# Patient Record
Sex: Male | Born: 1985 | Race: White | Hispanic: No | Marital: Single | Smoking: Current every day smoker
Health system: Southern US, Community
[De-identification: ages and names within clinical notes are randomized; demographics above are authoritative.]

---

## 2007-12-14 ENCOUNTER — Emergency Department (HOSPITAL_COMMUNITY): Admission: EM | Admit: 2007-12-14 | Discharge: 2007-12-14 | Payer: Self-pay | Admitting: Emergency Medicine

## 2008-05-26 ENCOUNTER — Emergency Department (HOSPITAL_COMMUNITY): Admission: EM | Admit: 2008-05-26 | Discharge: 2008-05-27 | Payer: Self-pay | Admitting: Emergency Medicine

## 2010-06-17 LAB — DIFFERENTIAL
Basophils Absolute: 0.1 10*3/uL (ref 0.0–0.1)
Eosinophils Absolute: 0.1 10*3/uL (ref 0.0–0.7)
Eosinophils Relative: 1 % (ref 0–5)
Lymphocytes Relative: 25 % (ref 12–46)
Lymphs Abs: 3.3 10*3/uL (ref 0.7–4.0)
Monocytes Absolute: 0.9 10*3/uL (ref 0.1–1.0)
Monocytes Relative: 7 % (ref 3–12)
Neutro Abs: 8.8 10*3/uL — ABNORMAL HIGH (ref 1.7–7.7)

## 2010-06-17 LAB — BASIC METABOLIC PANEL
CO2: 24 mEq/L (ref 19–32)
Calcium: 9.1 mg/dL (ref 8.4–10.5)
Chloride: 101 mEq/L (ref 96–112)
Creatinine, Ser: 0.81 mg/dL (ref 0.4–1.5)
GFR calc Af Amer: >60 mL/min (ref >60)
Glucose, Bld: 102 mg/dL — ABNORMAL HIGH (ref 70–99)
Potassium: 3.3 mEq/L — ABNORMAL LOW (ref 3.5–5.1)
Sodium: 134 mEq/L — ABNORMAL LOW (ref 135–145)

## 2010-06-17 LAB — CBC
MCV: 87.7 fL (ref 78.0–100.0)
WBC: 13.2 10*3/uL — ABNORMAL HIGH (ref 4.0–10.5)

## 2010-06-17 LAB — POCT CARDIAC MARKERS

## 2010-11-22 ENCOUNTER — Emergency Department (HOSPITAL_COMMUNITY)
Admission: EM | Admit: 2010-11-22 | Discharge: 2010-11-22 | Disposition: A | Discharge status: Home / Self Care | Payer: Self-pay | Attending: Emergency Medicine | Admitting: Emergency Medicine

## 2010-11-22 ENCOUNTER — Encounter: Payer: Self-pay | Admitting: *Deleted

## 2010-11-22 DIAGNOSIS — L989 Disorder of the skin and subcutaneous tissue, unspecified: Secondary | ICD-10-CM | POA: Insufficient documentation

## 2010-11-22 DIAGNOSIS — L089 Local infection of the skin and subcutaneous tissue, unspecified: Secondary | ICD-10-CM

## 2010-11-22 MED ORDER — BACITRACIN-NEOMYCIN-POLYMYXIN 400-5-5000 EX OINT
TOPICAL_OINTMENT | Freq: Once | CUTANEOUS | Status: AC
  Administered 2010-11-22: 21:00:00 via TOPICAL
  Filled 2010-11-22: qty 1

## 2010-11-22 MED ORDER — DOXYCYCLINE HYCLATE 100 MG PO TABS
100.0000 mg | ORAL_TABLET | Freq: Once | ORAL | Status: AC
  Administered 2010-11-22: 100 mg via ORAL
  Filled 2010-11-22: qty 1

## 2010-11-22 MED ORDER — DOXYCYCLINE HYCLATE 100 MG PO CAPS: 100.0000 mg | ORAL_CAPSULE | Freq: Two times a day (BID) | ORAL | Status: AC

## 2010-11-22 MED ORDER — HYDROCODONE-ACETAMINOPHEN 7.5-325 MG PO TABS: 1.0000 | ORAL_TABLET | Freq: Four times a day (QID) | ORAL | Status: AC | PRN

## 2010-11-22 MED ORDER — HYDROCODONE-ACETAMINOPHEN 5-325 MG PO TABS
1.0000 | ORAL_TABLET | Freq: Once | ORAL | Status: AC
  Administered 2010-11-22: 1 via ORAL
  Filled 2010-11-22: qty 1

## 2010-11-22 NOTE — ED Notes (Signed)
Pt states that he thinks that he may have bitten by a spider, isn't sure, has area with scab and redness around scab to inside of left thumb area. No drainage noted, pt states that the area has gotten bigger, more painful

## 2010-11-22 NOTE — ED Notes (Signed)
Infected and painful thumb, possible spider bite

## 2010-11-22 NOTE — ED Provider Notes (Signed)
History     CSN: [id number] Arrival date & time: 11/22/2010  7:56 PM   Chief Complaint  Patient presents with  . Insect Bite     (Include location/radiation/quality/duration/timing/severity/associated sxs/prior treatment) HPI Comments: Patient c/o lesion to the right thumb for several days.  States he may have been bitten by something.  States he has tried squeezing the area but noticed increased pain, thickening of the skin and redness surrounding the lesion.  Denies numbness, fever or swelling  Patient is a 25 y.o. male presenting with hand pain. The history is provided by the patient.  Hand Pain This is a new problem. The current episode started in the past 7 days. The problem occurs constantly. The problem has been unchanged. Associated symptoms include myalgias. Pertinent negatives include no arthralgias, fever, nausea, neck pain, numbness, rash or weakness. Exacerbated by: movement , palpation. He has tried nothing for the symptoms. The treatment provided no relief.     History reviewed. No pertinent past medical history.   History reviewed. No pertinent past surgical history.  History reviewed. No pertinent family history.  History  Substance Use Topics  . Smoking status: Not on file  . Smokeless tobacco: Not on file  . Alcohol Use: Not on file      Review of Systems  Constitutional: Negative for fever.  HENT: Negative for neck pain.   Gastrointestinal: Negative for nausea.  Musculoskeletal: Positive for myalgias. Negative for arthralgias.  Skin: Positive for color change and wound. Negative for pallor and rash.  Neurological: Negative for weakness and numbness.  Hematological: Negative for adenopathy. Does not bruise/bleed easily.  All other systems reviewed and are negative.    Allergies  Tramadol  Home Medications  No current outpatient prescriptions on file.  Physical Exam    BP 128/71  Pulse 83  Temp(Src) 97.9 F (36.6 C) (Oral)  Resp 20  Ht 5'  8" (1.727 m)  Wt 155 lb (70.308 kg)  BMI 23.57 kg/m2  SpO2 98%  Physical Exam  Nursing note and vitals reviewed. Constitutional: He is oriented to person, place, and time. He appears well-developed and well-nourished. No distress.  HENT:  Head: Normocephalic and atraumatic.  Cardiovascular: Normal rate, regular rhythm and normal heart sounds.   Pulmonary/Chest: Effort normal and breath sounds normal.  Musculoskeletal: He exhibits tenderness. He exhibits no edema.  Neurological: He is alert and oriented to person, place, and time. He has normal reflexes. He displays normal reflexes. No cranial nerve deficit. He exhibits normal muscle tone. Coordination normal.  Skin: Skin is warm. There is erythema.       2 cm indurated lesion to the proximal right thumb.  No fluctuance, drainage.  Surrounding area is slight erythematous.  No necrotic appearing tissue    ED Course  Procedures        MDM   2 cm slightly erythematous lesion to the medial left thumb.  No drainage, lymphangitis or fluctuance.  Sensation of the thumb is intact.  CR<2 sec, pt has full ROM of the digit.  Pt agrees to return to ER for any worsening symptoms  Probable infected insect bite vs early abscess.  I Dohn begin abx and have pt apply warm water soaks and return here for recheck.  Patient / Family / Caregiver understand and agree with initial ED impression and plan with expectations set for ED visit.        Berl Bonfanti L. [place], [place] 11/29/10 2140

## 2010-11-25 ENCOUNTER — Encounter (HOSPITAL_COMMUNITY): Payer: Self-pay | Admitting: *Deleted

## 2010-11-25 ENCOUNTER — Emergency Department (HOSPITAL_COMMUNITY)
Admission: EM | Admit: 2010-11-25 | Discharge: 2010-11-25 | Disposition: A | Discharge status: Home / Self Care | Payer: Self-pay | Attending: Emergency Medicine | Admitting: Emergency Medicine

## 2010-11-25 DIAGNOSIS — L0291 Cutaneous abscess, unspecified: Secondary | ICD-10-CM

## 2010-11-25 DIAGNOSIS — Z5189 Encounter for other specified aftercare: Secondary | ICD-10-CM | POA: Insufficient documentation

## 2010-11-25 DIAGNOSIS — L03019 Cellulitis of unspecified finger: Secondary | ICD-10-CM | POA: Insufficient documentation

## 2010-11-25 DIAGNOSIS — L02519 Cutaneous abscess of unspecified hand: Secondary | ICD-10-CM | POA: Insufficient documentation

## 2010-11-25 MED ORDER — OXYCODONE-ACETAMINOPHEN 5-325 MG PO TABS
1.0000 | ORAL_TABLET | Freq: Once | ORAL | Status: AC
  Administered 2010-11-25: 1 via ORAL
  Filled 2010-11-25: qty 1

## 2010-11-25 MED ORDER — OXYCODONE-ACETAMINOPHEN 5-325 MG PO TABS: 1.0000 | ORAL_TABLET | ORAL | Status: AC | PRN

## 2010-11-25 MED ORDER — DOXYCYCLINE HYCLATE 100 MG PO TABS
100.0000 mg | ORAL_TABLET | Freq: Once | ORAL | Status: AC
  Administered 2010-11-25: 100 mg via ORAL
  Filled 2010-11-25: qty 1

## 2010-11-25 NOTE — ED Provider Notes (Signed)
History     CSN: [id number] Arrival date & time: 11/25/2010  3:18 PM  Chief Complaint  Patient presents with  . Wound Check    HPI  (Consider location/radiation/quality/duration/timing/severity/associated sxs/prior treatment)  Patient is a 25 y.o. male presenting with wound check. The history is provided by the patient.  Wound Check  He was treated in the ED 2 to 3 days ago. Previous treatment in the ED includes oral antibiotics. Treatments since wound repair include oral antibiotics, soaks and regular soap and water washings. Fever duration: no fever. There has been colored discharge from the wound. The redness has improved. The swelling has improved. The pain has worsened. He has no difficulty moving the affected extremity or digit.    History reviewed. No pertinent past medical history.  History reviewed. No pertinent past surgical history.  No family history on file.  History  Substance Use Topics  . Smoking status: Current Everyday Smoker    Types: Cigarettes  . Smokeless tobacco: Not on file  . Alcohol Use: No      Review of Systems  Review of Systems  Constitutional: Negative for fever.  Musculoskeletal: Negative for joint swelling and arthralgias.  Skin: Positive for wound. Negative for color change.  Neurological: Negative for dizziness, weakness and numbness.  Hematological: Negative for adenopathy. Does not bruise/bleed easily.  All other systems reviewed and are negative.    Allergies  Tramadol  Home Medications   Current Outpatient Rx  Name Route Sig Dispense Refill  . DOXYCYCLINE HYCLATE 100 MG PO CAPS Oral Take 1 capsule (100 mg total) by mouth 2 (two) times daily. For 10 days 20 capsule 0  . HYDROCODONE-ACETAMINOPHEN 7.5-325 MG PO TABS Oral Take 1 tablet by mouth every 6 (six) hours as needed for pain. 20 tablet 0    Physical Exam    BP 122/79  Pulse 66  Temp(Src) 97.7 F (36.5 C) (Oral)  Resp 16  Ht 5\' 8"  (1.727 m)  Wt 155 lb (70.308  kg)  BMI 23.57 kg/m2  SpO2 100%  Physical Exam  Nursing note and vitals reviewed. Constitutional: He appears well-developed and well-nourished. No distress.  Cardiovascular: Normal rate and regular rhythm.   Pulmonary/Chest: Effort normal and breath sounds normal.  Musculoskeletal: He exhibits tenderness. He exhibits no edema.       Right hand: He exhibits tenderness. He exhibits normal range of motion, no bony tenderness, normal two-point discrimination, normal capillary refill and no swelling. normal sensation noted. Normal strength noted.       Lesion to right thumb appears to be healing,  Pervious erythema improved.  No edema.  Distal sensation and ROM of the thumb is intact. CR< 2 sec  Neurological: He is alert. He has normal reflexes. He exhibits normal muscle tone. Coordination normal.  Skin: Skin is warm and dry. No erythema.       See MS exam    ED Course  Procedures (including critical care time)      MDM   4:57 PM Abscess to the right proximal thumb appears to be healing.  Slight drainage present.  Erythema improved.  I have have advised pt to return if sx's worsen and he agrees to continue the antibiotics as directed.  Distal sensation of the thumb is intact.  Pt has full ROm of the digit.  CR<2 sec.        Daksh Coates L. [place], [place] 12/04/10 1830

## 2010-11-25 NOTE — ED Notes (Signed)
Seen 9/17 for abscess - states is not any better.  Now having drainage with increased pain/swelling.

## 2010-11-30 NOTE — ED Provider Notes (Signed)
Medical screening examination/treatment/procedure(s) were performed by non-physician practitioner and as supervising physician I was immediately available for consultation/collaboration.   Benny Lennert, MD 11/30/10 [id number]

## 2010-12-07 LAB — POCT CARDIAC MARKERS
CKMB, poc: 3.6
Myoglobin, poc: 224
Troponin i, poc: <0.05

## 2010-12-07 LAB — COMPREHENSIVE METABOLIC PANEL
AST: 38 — ABNORMAL HIGH
Albumin: 4.2
Alkaline Phosphatase: 95
CO2: 26
Chloride: 104
Creatinine, Ser: 0.92
Sodium: 137
Total Bilirubin: 0.6

## 2010-12-07 LAB — CBC
Hemoglobin: 14.4
Platelets: 282
RDW: 12.7
WBC: 15.8 — ABNORMAL HIGH

## 2010-12-07 LAB — DIFFERENTIAL
Eosinophils Absolute: 0.1
Lymphocytes Relative: 11 — ABNORMAL LOW
Monocytes Absolute: 0.9
Neutro Abs: 13.1 — ABNORMAL HIGH
Neutrophils Relative %: 83 — ABNORMAL HIGH

## 2010-12-07 LAB — LIPASE, BLOOD: Lipase: 18

## 2010-12-07 NOTE — ED Provider Notes (Signed)
Medical screening examination/treatment/procedure(s) were performed by non-physician practitioner and as supervising physician I was immediately available for consultation/collaboration.   Daouda Lonzo W. Maddon Horton, MD 12/07/10 1255 

## 2012-01-16 ENCOUNTER — Encounter (INDEPENDENT_AMBULATORY_CARE_PROVIDER_SITE_OTHER): Payer: Self-pay | Admitting: General Surgery

## 2012-01-24 ENCOUNTER — Ambulatory Visit (INDEPENDENT_AMBULATORY_CARE_PROVIDER_SITE_OTHER): Payer: Self-pay | Admitting: General Surgery

## 2012-01-27 ENCOUNTER — Ambulatory Visit (INDEPENDENT_AMBULATORY_CARE_PROVIDER_SITE_OTHER): Payer: Medicaid Other | Admitting: General Surgery

## 2012-01-27 ENCOUNTER — Encounter (INDEPENDENT_AMBULATORY_CARE_PROVIDER_SITE_OTHER): Payer: Self-pay | Admitting: General Surgery

## 2012-01-27 VITALS — BP 116/78 | HR 67 | Temp 97.4°F | Ht 67.0 in | Wt 143.2 lb

## 2012-01-27 DIAGNOSIS — K4090 Unilateral inguinal hernia, without obstruction or gangrene, not specified as recurrent: Secondary | ICD-10-CM

## 2012-01-27 NOTE — Progress Notes (Signed)
Patient ID: Nicoletta Dress, male   DOB: [birth year], 26 y.o.   MRN: [id number]  Chief Complaint  Patient presents with  . Pre-op Exam    eval RIH    HPI Mance A Panek is a 26 y.o. male.Referred by Dr. Caryn Bee guarding or hernia. Patient states this occurred several weeks ago after lifting heavy objects at work. Patient states the pain has been causing some weakness and pain radiating down his leg.  Patient had no urinary symptoms or other problems.he has no signs of incarceration coagulation. HPI  History reviewed. No pertinent past medical history.  History reviewed. No pertinent past surgical history.  Family History  Problem Relation Age of Onset  . Hyperlipidemia Mother   . Hypertension Father     Social History History  Substance Use Topics  . Smoking status: Current Every Day Smoker -- 0.5 packs/day    Types: Cigarettes  . Smokeless tobacco: Not on file  . Alcohol Use: Yes     Comment: social    Allergies  Allergen Reactions  . Tramadol Nausea And Vomiting    Current Outpatient Prescriptions  Medication Sig Dispense Refill  . HYDROcodone-acetaminophen (VICODIN) 5-500 MG per tablet Take 1 tablet by mouth every 6 (six) hours as needed.        Review of Systems Review of Systems  Constitutional: Negative.   HENT: Negative.   Eyes: Negative.   Respiratory: Negative.   Cardiovascular: Negative.   Gastrointestinal: Negative.   Genitourinary: Negative.   Musculoskeletal: Negative.     Blood pressure 116/78, pulse 67, temperature 97.4 F (36.3 C), temperature source Temporal, height 5\' 7"  (1.702 m), weight 143 lb 3.2 oz (64.955 kg), SpO2 97.00%.  Physical Exam Physical Exam  Constitutional: He is oriented to person, place, and time. He appears well-developed and well-nourished.  HENT:  Head: Normocephalic and atraumatic.  Eyes: Conjunctivae normal and EOM are normal. Pupils are equal, round, and reactive to light.  Neck: Normal range of motion. Neck supple.    Cardiovascular: Normal rate and regular rhythm.   Pulmonary/Chest: Effort normal and breath sounds normal.  Abdominal: Soft. Bowel sounds are normal. A hernia is present. Hernia confirmed positive in the right inguinal area. Hernia confirmed negative in the left inguinal area.  Musculoskeletal: Normal range of motion.  Neurological: He is alert and oriented to person, place, and time.    Data Reviewed none  Assessment    26 year old male with a reducible right lower hernia.    Plan    1. We'll proceed to the operating room for laparoscopic radical hernia repair mesh.  2.All risks and benefits were discussed with the patient, to generally include infection, bleeding, damage to surrounding structures, and recurrence. Alternatives were offered and described.  All questions were answered and the patient voiced understanding of the procedure and wishes to proceed at this point.        Marigene Ehlers., Abhinav Mayorquin 01/27/2012, 2:29 PM

## 2012-01-30 ENCOUNTER — Telehealth (INDEPENDENT_AMBULATORY_CARE_PROVIDER_SITE_OTHER): Payer: Self-pay | Admitting: General Surgery

## 2012-01-30 NOTE — Telephone Encounter (Signed)
Pt called to ask about stool softeners.  Discussed his upcoming surgery for hernia repair and the pain he has when having BMs.  Advised to begin now using OTC Miralax and stool softeners QD.  If he has a well-established regimen for BMs before his surgery, it might benefit him after the surgery.  Reminded him that pain medicine is constipating as well.  He wants to ask Dr. Derrell Lolling for enough pain meds to last until his surgery which is scheduled in December.  Please advise and call pt.

## 2012-01-30 NOTE — Telephone Encounter (Signed)
Called back patient to advise the prescription has been filled by Dr. Derrell Lolling and ready for pickup. Advised patient to bring ID with him. Patient agreed.

## 2012-02-06 ENCOUNTER — Telehealth (INDEPENDENT_AMBULATORY_CARE_PROVIDER_SITE_OTHER): Payer: Self-pay

## 2012-02-06 NOTE — Telephone Encounter (Signed)
Pt requesting Oxycodone 10/325 mg pending his surgery on 02/27/12.  Dr. Derrell Lolling was paged and ok'd #[phone].  Pt was made aware no more pain medication would be filled before his surgery.

## 2012-02-10 ENCOUNTER — Ambulatory Visit (INDEPENDENT_AMBULATORY_CARE_PROVIDER_SITE_OTHER): Payer: Self-pay | Admitting: General Surgery

## 2012-02-13 ENCOUNTER — Telehealth (INDEPENDENT_AMBULATORY_CARE_PROVIDER_SITE_OTHER): Payer: Self-pay

## 2012-02-13 NOTE — Telephone Encounter (Signed)
The patient called requesting pain medicine.  He had been on Oxycodone but is asking for Hydrocodone.  His surgery is 12/23.  It feels like he has a bruise in the area.  He has no bulge at this time because he hasn't been heavy lifting. I paged Dr Derrell Lolling and he denied filling any more narcotics.  He recommends Ibuprofen 800mg  q 8 hrs.  I notified the pt and told him he can get it over the counter.

## 2012-02-21 ENCOUNTER — Telehealth (INDEPENDENT_AMBULATORY_CARE_PROVIDER_SITE_OTHER): Payer: Self-pay | Admitting: General Surgery

## 2012-02-21 NOTE — Telephone Encounter (Signed)
Pt called to ask for more narcotic pain meds; advised the Ibuprofen is not helping.  Reviewed notes from 02/13/12 where he was denied narcotics until after his surgery.  Reiterated this to him and suggested he add using a heating pad.  He was verbally unhappy with this.

## 2012-02-27 DIAGNOSIS — K4090 Unilateral inguinal hernia, without obstruction or gangrene, not specified as recurrent: Secondary | ICD-10-CM

## 2012-02-27 HISTORY — PX: HERNIA REPAIR: SHX51

## 2012-02-28 ENCOUNTER — Telehealth (INDEPENDENT_AMBULATORY_CARE_PROVIDER_SITE_OTHER): Payer: Self-pay

## 2012-02-28 NOTE — Telephone Encounter (Signed)
Patient calling into office requesting a refill on his pain medication.  Patient states he had hernia repair surgery on 02/27/12.  Patient worried our office would be closed and not able to have pain medication.  Patient made aware that it's too early to refill pain medication since his surgery was yesterday.  Patient made aware that our office [facility] be open on Thursday and Friday and the message Matthewjames be forwarded to Dr. Derrell Lolling for further review.  Patient advised to apply heat or ice, take ibuprofen and elevate legs when at rest.  Alyxander call patient after reviewed with Dr. Derrell Lolling

## 2012-03-01 ENCOUNTER — Telehealth (INDEPENDENT_AMBULATORY_CARE_PROVIDER_SITE_OTHER): Payer: Self-pay | Admitting: General Surgery

## 2012-03-01 NOTE — Telephone Encounter (Signed)
Patient called requesting a refill of oxycodone 5/325 - he is status post RIH repair on Monday 02/27/12. I offered to call in Norco 5/325 per protocol but patient states only the oxycodone is working for his pain. Please advise if we can write.

## 2012-03-01 NOTE — Telephone Encounter (Signed)
Shanda Bumps, pt's girlfriend and mother of his child, called to request more pain medicine for him.  She stated he was in horrible pain and had "overdone it" yesterday; picking up his nearly 20 lbs infant and generally too much activity for Christmas.  Reviewed notes from pt's call earlier today and offer of appt.  Shanda Bumps accepted appt for tomorrow at 3:00 and Emrick use an ice pack to the site for now.  Also recommended NSAIDs, per label directions.

## 2012-03-01 NOTE — Telephone Encounter (Signed)
Per Dr Derrell Lolling if patient still in a lot of pain he needs to be seen in the office prior to refill of narcotics. I advised patient of this. I offered appt for tomorrow at 2:45 pm. Patient was not happy with this and hung up on me.

## 2012-03-02 ENCOUNTER — Encounter (INDEPENDENT_AMBULATORY_CARE_PROVIDER_SITE_OTHER): Payer: Medicaid Other | Admitting: General Surgery

## 2012-03-13 ENCOUNTER — Telehealth (INDEPENDENT_AMBULATORY_CARE_PROVIDER_SITE_OTHER): Payer: Self-pay | Admitting: General Surgery

## 2012-03-13 ENCOUNTER — Encounter (INDEPENDENT_AMBULATORY_CARE_PROVIDER_SITE_OTHER): Payer: Medicaid Other | Admitting: General Surgery

## 2012-03-13 NOTE — Telephone Encounter (Signed)
Pt called to ask Dr. Derrell Lolling for additional pain med refill.  New phone number to call:  [phone].

## 2012-03-16 ENCOUNTER — Telehealth (INDEPENDENT_AMBULATORY_CARE_PROVIDER_SITE_OTHER): Payer: Self-pay

## 2012-03-16 NOTE — Telephone Encounter (Signed)
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 No redness. No swelling. No bulge. No fever. Pt advised since surgery was on other side it probably is not related to surgery. Pt to treat as groin pull and use ice pack with advil. Pt advised if swelling,fever,bulge or redness occur he is to call md on call over weekend or go to Er for evaluation. Pt states he understands.

## 2012-03-19 ENCOUNTER — Encounter (INDEPENDENT_AMBULATORY_CARE_PROVIDER_SITE_OTHER): Payer: Self-pay | Admitting: General Surgery

## 2012-03-19 ENCOUNTER — Telehealth (INDEPENDENT_AMBULATORY_CARE_PROVIDER_SITE_OTHER): Payer: Self-pay

## 2012-03-19 NOTE — Telephone Encounter (Signed)
I tried to reach the pt to let him know Dr Derrell Lolling gave the ok for me to call in Ibuprofen 800mg  or he can take 4 of the otc Ibuprofen.  He wasn't home so I spoke to the mom.  She said it would be better if we called it in.  I also told her if he is having so much pain and wants to see Dr Derrell Lolling there are still openings this week.  She Kamin discuss with the pt and call us back tomorrow.  I called in 800 mg Ibuprofen #20 take one po bid no refill to [facility] in [place].

## 2012-03-19 NOTE — Telephone Encounter (Signed)
The pt called complaining of some soreness that he is still having.  He is requesting Ibuprofen 800mg  to be called in because it helps more than Tylenol or over the counter Ibuprofen. He is not on anything else for pain.  I told him I would have to ask Dr Derrell Lolling and we Ariston let him know.  [facility] [place].

## 2012-03-30 ENCOUNTER — Encounter (INDEPENDENT_AMBULATORY_CARE_PROVIDER_SITE_OTHER): Payer: Self-pay | Admitting: General Surgery

## 2012-03-30 ENCOUNTER — Emergency Department (HOSPITAL_COMMUNITY)
Admission: EM | Admit: 2012-03-30 | Discharge: 2012-03-30 | Disposition: A | Discharge status: Home / Self Care | Payer: Medicaid Other | Source: Home / Self Care | Attending: Emergency Medicine | Admitting: Emergency Medicine

## 2012-03-30 ENCOUNTER — Encounter (INDEPENDENT_AMBULATORY_CARE_PROVIDER_SITE_OTHER): Payer: Medicaid Other | Admitting: General Surgery

## 2012-03-30 ENCOUNTER — Ambulatory Visit (INDEPENDENT_AMBULATORY_CARE_PROVIDER_SITE_OTHER): Payer: Medicaid Other | Admitting: General Surgery

## 2012-03-30 ENCOUNTER — Encounter (HOSPITAL_COMMUNITY): Payer: Self-pay

## 2012-03-30 VITALS — BP 118/72 | HR 75 | Temp 97.0°F | Ht 66.0 in | Wt 146.2 lb

## 2012-03-30 DIAGNOSIS — Z8719 Personal history of other diseases of the digestive system: Secondary | ICD-10-CM

## 2012-03-30 DIAGNOSIS — Z9889 Other specified postprocedural states: Secondary | ICD-10-CM

## 2012-03-30 DIAGNOSIS — F411 Generalized anxiety disorder: Secondary | ICD-10-CM

## 2012-03-30 DIAGNOSIS — F419 Anxiety disorder, unspecified: Secondary | ICD-10-CM

## 2012-03-30 MED ORDER — ALPRAZOLAM 1 MG PO TABS: 1.0000 mg | ORAL_TABLET | Freq: Three times a day (TID) | ORAL | Status: DC | PRN

## 2012-03-30 MED ORDER — ALPRAZOLAM 1 MG PO TBDP: 1.0000 mg | ORAL_TABLET | Freq: Three times a day (TID) | ORAL | Status: DC | PRN

## 2012-03-30 NOTE — ED Provider Notes (Signed)
Chief Complaint  Patient presents with  . Chest Pain    History of Present Illness:   Jonathan Khan is a 27 year old male with a two-year history of anxiety. The patient had been on alprazolam 1 mg 3 times a day. He left his previous primary care physician and does not have an appointment with a new primary care physician for another week. He ran out of the alprazolam and shortly thereafter began to have symptoms of anxiety and nervousness with chest tightness, chest pain, shortness of breath, palpitations, dizziness, tremulousness, and feelings of anxiety and nervousness. He denies any depression, crying, or suicidal thoughts. No use of alcohol or recreational drugs. He recently had surgery for an umbilical hernia done by Dr. Derrell Lolling on December 23. This is healing up well. He's allergic to tramadol. He is otherwise in good health.  Review of Systems:  Other than noted above, the patient denies any of the following symptoms: Systemic:  No fever, chills, sweats, fatigue, weight loss or gain. Resp:  No shortness of breath. Cardiovasc:  No chest pain, tightness, pressure, palpitations, dizziness, or syncope. GI:  No abdominal pain, nausea, vomiting, anorexia, diarrhea, or constipation. Neuro:  No headache, paresthesias, tremor, or muscle weakness. Psych:  No sadness, depression, crying, anxiety, panic, sleep disturbance, or suicidal or homicidal ideation.  No hallucinations or delusions.  PMFSH:  Past medical history, family history, social history, meds, and allergies were reviewed.  Physical Exam:   Vital signs:  BP 144/82  Pulse 73  Temp 98.3 F (36.8 C) (Oral)  Resp 16  SpO2 100% Gen:  Alert, oriented, in no distress. Lungs:  No respiratory distress.  Breath sounds clear and equal bilaterally.  No wheezes, rales, or rhonchi. Heart:  Regular rthythm.  No gallops, murmers, clicks or rubs. Abdomen:  Soft, flat and nontender.  No organomegaly or mass. Neuro:  Alert and oriented times 3.  Speech clear, fluent and appropriate.  Cranial nerves intact.  No focal weakness. Psych:  Mood and affect normal.  Speech pattern normal.  Thought content normal with no suicidal or homicidal ideation.  No paranoia, hallucinations, or delusions.  Memory, insight, and judgement normal.  Assessment:  The encounter diagnosis was Anxiety.  Plan:   1.  The following meds were prescribed:   New Prescriptions   ALPRAZOLAM (XANAX) 1 MG TABLET    Take 1 tablet (1 mg total) by mouth 3 (three) times daily as needed for sleep.   2.  The patient was instructed in symptomatic care and handouts were given. 3.  The patient was told to return if becoming worse in any way, if no better in 3 or 4 days, and given some red flag symptoms that would indicate earlier return.    Reuben Likes, MD 03/30/12 2011

## 2012-03-30 NOTE — Progress Notes (Signed)
Patient ID: Jonathan Khan, male   DOB: 08/12/1985, 27 y.o.   MRN: [id number] This 27 year old status post laptop regarding her apparent masses. Patient is to do well postoperatively. Patient has issues with anxiety is being treated by his family practitioner. He states he has had at this point is degrees cause [place] concerned he has an appointment with psychiatrist in 7 days.  On exam: wounds clean dry and intact There's no hernia palpation  Assessment and plan. Patient follow up when necessary

## 2012-03-30 NOTE — ED Notes (Signed)
Reportedly he and all of his family members have problems w anxiety. Pt reports he is in process of switching MD's , and Perfecto see his new MD Friday of next week. Until he sees new MD, he Eufemio be out of his xanax .Last dose this  AM, and has been skipping doses to have medication States his chest pain is in central chest, unable to reproduce pain

## 2012-04-12 ENCOUNTER — Encounter (HOSPITAL_COMMUNITY): Payer: Self-pay | Admitting: *Deleted

## 2012-04-12 ENCOUNTER — Emergency Department (HOSPITAL_COMMUNITY)
Admission: EM | Admit: 2012-04-12 | Discharge: 2012-04-12 | Disposition: A | Discharge status: Home / Self Care | Payer: Medicaid Other | Attending: Emergency Medicine | Admitting: Emergency Medicine

## 2012-04-12 ENCOUNTER — Emergency Department (HOSPITAL_COMMUNITY): Payer: Medicaid Other

## 2012-04-12 DIAGNOSIS — F411 Generalized anxiety disorder: Secondary | ICD-10-CM | POA: Insufficient documentation

## 2012-04-12 DIAGNOSIS — R079 Chest pain, unspecified: Secondary | ICD-10-CM | POA: Insufficient documentation

## 2012-04-12 DIAGNOSIS — Z8719 Personal history of other diseases of the digestive system: Secondary | ICD-10-CM | POA: Insufficient documentation

## 2012-04-12 DIAGNOSIS — F419 Anxiety disorder, unspecified: Secondary | ICD-10-CM

## 2012-04-12 DIAGNOSIS — F17200 Nicotine dependence, unspecified, uncomplicated: Secondary | ICD-10-CM | POA: Insufficient documentation

## 2012-04-12 DIAGNOSIS — Z79899 Other long term (current) drug therapy: Secondary | ICD-10-CM | POA: Insufficient documentation

## 2012-04-12 MED ORDER — LORAZEPAM 1 MG PO TABS
1.0000 mg | ORAL_TABLET | Freq: Once | ORAL | Status: AC
  Administered 2012-04-12: 1 mg via ORAL
  Filled 2012-04-12: qty 1

## 2012-04-12 MED ORDER — ALPRAZOLAM 1 MG PO TABS: 1.0000 mg | ORAL_TABLET | Freq: Three times a day (TID) | ORAL | Status: DC | PRN

## 2012-04-12 MED ORDER — ALBUTEROL SULFATE HFA 108 (90 BASE) MCG/ACT IN AERS: 1.0000 | INHALATION_SPRAY | Freq: Four times a day (QID) | RESPIRATORY_TRACT | Status: DC | PRN

## 2012-04-12 NOTE — ED Provider Notes (Signed)
History  This chart was scribed for Shelda Jakes, MD by Bennett Scrape, ED Scribe. This patient was seen in room APA01/APA01 and the patient's care was started at 8:54 PM.  CSN: [id number]  Arrival date & time 04/12/12  2013   First MD Initiated Contact with Patient 04/12/12 2054      Chief Complaint  Patient presents with  . Chest Pain     Patient is a 27 y.o. male presenting with chest pain. The history is provided by the patient. No language interpreter was used.  Chest Pain The chest pain began 6 - 12 hours ago. Chest pain occurs constantly. The chest pain is worsening. At its most intense, the pain is at 6/10. The quality of the pain is described as pressure-like. The pain does not radiate. Chest pain is worsened by stress. Pertinent negatives for primary symptoms include no fever, no abdominal pain, no nausea and no vomiting. He tried nothing for the symptoms. Risk factors include stress.  Pertinent negatives for past medical history include no COPD, no diabetes and no MI.     Jonathan Khan is a 27 y.o. male who presents to the Emergency Department complaining of gradual onset, gradually worsening, intermittent central CP described as pressure attributed to anxiety that started 8 to 9 hours ago. He states that he just split up with his finance and has been upset due to not being able to see her child. He rates his pain a 5 or 6 out of 10 currently. He reports that the pain is improved with deep breathes and wearing 2L of O2 in the ED has improved his symptoms. He reports that he has prior episodes of similar symptoms attributed to anxiety. He was seen recently at an Urgent Care for the same and given xanax with improvement. He states that he has an appointment with [facility] in March for his anxiety. He does not have a h/o chronic medical conditions and is a current everyday smoker and occasional alcohol user.  History reviewed. No pertinent past medical history.  Past  Surgical History  Procedure Date  . Hernia repair 02/27/12    RIH    Family History  Problem Relation Age of Onset  . Hyperlipidemia Mother   . Hypertension Father     History  Substance Use Topics  . Smoking status: Current Every Day Smoker -- 0.5 packs/day    Types: Cigarettes  . Smokeless tobacco: Not on file  . Alcohol Use: Yes     Comment: social      Review of Systems  Constitutional: Negative for fever and chills.  HENT: Negative for congestion, sore throat, rhinorrhea and neck pain.   Eyes: Negative for visual disturbance.  Cardiovascular: Positive for chest pain.  Gastrointestinal: Negative for nausea, vomiting, abdominal pain and diarrhea.  Genitourinary: Negative for dysuria.  Musculoskeletal: Negative for back pain.  Skin: Negative for rash.  Neurological: Negative for headaches.  All other systems reviewed and are negative.    Allergies  Tramadol  Home Medications   Current Outpatient Rx  Name  Route  Sig  Dispense  Refill  . ALPRAZOLAM 1 MG PO TABS   Oral   Take 1 tablet (1 mg total) by mouth 3 (three) times daily as needed for sleep.   30 tablet   0   . IBUPROFEN 800 MG PO TABS   Oral   Take 800 mg by mouth every 8 (eight) hours as needed. For pain         .  ALBUTEROL SULFATE HFA 108 (90 BASE) MCG/ACT IN AERS   Inhalation   Inhale 1-2 puffs into the lungs every 6 (six) hours as needed for wheezing.   1 Inhaler   0   . ALPRAZOLAM 1 MG PO TABS   Oral   Take 1 tablet (1 mg total) by mouth 3 (three) times daily as needed for sleep.   15 tablet   0     Triage Vitals: BP 133/76  Pulse 90  Temp 97.9 F (36.6 C) (Oral)  Resp 18  Ht 5\' 6"  (1.676 m)  Wt 138 lb (62.596 kg)  BMI 22.27 kg/m2  SpO2 98%  Physical Exam  Nursing note and vitals reviewed. Constitutional: He is oriented to person, place, and time. He appears well-developed and well-nourished. No distress.  HENT:  Head: Normocephalic and atraumatic.       Moist MM   Eyes: Conjunctivae normal and EOM are normal.  Neck: Neck supple. No tracheal deviation present.  Cardiovascular: Normal rate and regular rhythm.   No murmur heard. Pulmonary/Chest: Effort normal and breath sounds normal. No respiratory distress.  Abdominal: Soft. Bowel sounds are normal. There is no tenderness.  Musculoskeletal: Normal range of motion. He exhibits no edema.  Neurological: He is alert and oriented to person, place, and time. No cranial nerve deficit.       Pt able to move both sets of fingers and toes  Skin: Skin is warm and dry.  Psychiatric: He has a normal mood and affect. His behavior is normal.    ED Course  Procedures (including critical care time)  DIAGNOSTIC STUDIES: Oxygen Saturation is 98% on room air, normal by my interpretation.    COORDINATION OF CARE: 9:16 PM-Discussed treatment plan which includes CXR, CBC panel and UA with pt at bedside and pt agreed to plan.    Labs Reviewed - No data to display Dg Chest 2 View  04/12/2012  *RADIOLOGY REPORT*  Clinical Data: Mid chest pain and eighth, and goes for 10 hours. Smoker.  CHEST - 2 VIEW  Comparison: 05/26/2008  Findings: Pulmonary hyperinflation and interstitial fibrotic changes consistent with emphysema and chronic bronchitis.  Normal heart size and pulmonary vascularity.  No focal airspace consolidation in the lungs.  No blunting of costophrenic angles. No pneumothorax.  Mediastinal contours appear intact.  No significant changes since the previous study, allowing for technical differences.  IMPRESSION: Emphysematous and chronic bronchitic changes in the lungs.  No evidence of active pulmonary disease.   Original Report Authenticated By: Burman Nieves, M.D.     Date: 04/12/2012  Rate: 84  Rhythm: normal sinus rhythm  QRS Axis: normal  Intervals: normal  ST/T Wave abnormalities: normal  Conduction Disutrbances:none  Narrative Interpretation:   Old EKG Reviewed: none available Some sinus  arrhythmia.   1. Chest pain   2. Anxiety       MDM  Symptoms most likely consistent with anxiety and panic attack. EKG without acute changes and CXR negative except for showing signs of bronchitis, early COPD changes, Jordin Rx with albuterol inhaler. Patient has behavioral health follow up arranged. Improved with Ativan in ED.      I personally performed the services described in this documentation, which was scribed in my presence. The recorded information has been reviewed and is accurate.     Shelda Jakes, MD 04/13/12 [phone]

## 2012-04-12 NOTE — ED Notes (Signed)
Chest pain, onset today, thinks is due to anxiety.

## 2012-07-25 ENCOUNTER — Telehealth: Payer: Self-pay | Admitting: Physician Assistant

## 2012-07-25 NOTE — Telephone Encounter (Signed)
APPT MADE

## 2012-07-26 ENCOUNTER — Ambulatory Visit (INDEPENDENT_AMBULATORY_CARE_PROVIDER_SITE_OTHER): Payer: Medicaid Other | Admitting: Family Medicine

## 2012-07-26 ENCOUNTER — Encounter: Payer: Self-pay | Admitting: Family Medicine

## 2012-07-26 VITALS — BP 114/62 | HR 66 | Temp 97.2°F | Ht 67.5 in | Wt 147.0 lb

## 2012-07-26 DIAGNOSIS — R1032 Left lower quadrant pain: Secondary | ICD-10-CM

## 2012-07-26 MED ORDER — HYDROCODONE-ACETAMINOPHEN 5-325 MG PO TABS: ORAL_TABLET | ORAL | Status: DC

## 2012-07-26 NOTE — Patient Instructions (Addendum)
Use warm wet compresses over groin Avoid heavy lifting Continue ibuprofen 400 mg 3 times a day after meal Take prescription medicine if needed for severe pain Be aware that the Vicodin 5/325 could also cause some nausea and vomiting like the tramadol, try half to one the first dose and see if it helps the pain, without causing nausea and vomiting

## 2012-07-26 NOTE — Progress Notes (Signed)
  Subjective:    Patient ID: Jonathan Khan, male    DOB: 12/22/1985, 27 y.o.   MRN: [id number]  HPI  History of right inguinal hernia repair in December 2013. The patient works as a Curator in a garage. About 2 weeks ago he noticed abdominal pain left lower side and soreness. There've been no voiding changes. He has no burning with voiding. He has no GI symptoms. There is no nausea vomiting or diarrhea.  Review of Systems  Constitutional: Negative.   HENT: Negative.   Eyes: Negative.   Respiratory: Negative.   Cardiovascular: Negative.   Gastrointestinal: Positive for abdominal pain (low abdominal pain).  Endocrine: Negative.   Genitourinary: Positive for dysuria (little).  Musculoskeletal: Negative.   Skin: Negative.   Allergic/Immunologic: Negative.   Neurological: Negative.   Hematological: Negative.   Psychiatric/Behavioral: Negative.        Objective:   Physical Exam Patient is alert and oriented today. On abdominal exam he is slightly tender left upper quadrant right lower quadrant and left lower quadrant. There are no masses. There is no liver or spleen enlargement. The testicles are normal and there is no sign of any epididymitis or masses. On examination there was slight tenderness over the left inguinal area. With coughing there may be a slight bulge on the left side.       Assessment & Plan:  1. Abdominal pain, left lower quadrant - HYDROcodone-acetaminophen (NORCO/VICODIN) 5-325 MG per tablet; 1 daily if needed for pain  Dispense: 14 tablet; Refill: 0  2. Left inguinal pain - HYDROcodone-acetaminophen (NORCO/VICODIN) 5-325 MG per tablet; 1 daily if needed for pain  Dispense: 14 tablet; Refill: 0  3. Refer to surgeon to evaluate for possible inguinal hernia  Patient Instructions  Use warm wet compresses over groin Avoid heavy lifting Continue ibuprofen 400 mg 3 times a day after meal Take prescription medicine if needed for severe pain Be aware that the  Vicodin 5/325 could also cause some nausea and vomiting like the tramadol, try half to one the first dose and see if it helps the pain, without causing nausea and vomiting

## 2012-07-27 NOTE — Addendum Note (Signed)
Addended by: Magdalene River on: 07/27/2012 12:30 PM   Modules accepted: Orders

## 2012-08-09 ENCOUNTER — Encounter (INDEPENDENT_AMBULATORY_CARE_PROVIDER_SITE_OTHER): Payer: Medicaid Other | Admitting: General Surgery

## 2012-09-11 ENCOUNTER — Telehealth: Payer: Self-pay

## 2012-09-11 NOTE — Telephone Encounter (Signed)
Please give authorization for referral and send to referral pool. thanks

## 2012-09-11 NOTE — Telephone Encounter (Signed)
Need a referral back to hernia surgeon

## 2012-09-11 NOTE — Telephone Encounter (Signed)
Need to know hwt type of hernia he has in order to do referral

## 2012-09-13 NOTE — Telephone Encounter (Signed)
Has inguinal hernia. Had one repaired and Dr stated that he had a small spot that they did not repair but if he started having pain to come back. He is not having pain

## 2012-09-13 NOTE — Telephone Encounter (Signed)
Who did his original surgery?

## 2012-09-13 NOTE — Telephone Encounter (Signed)
Dr. Romero

## 2012-09-17 NOTE — Telephone Encounter (Signed)
Patient's mother calling again today about referral

## 2012-09-17 NOTE — Telephone Encounter (Signed)
Dr [facility]

## 2012-09-18 ENCOUNTER — Encounter (HOSPITAL_COMMUNITY): Payer: Self-pay

## 2012-09-18 ENCOUNTER — Emergency Department (HOSPITAL_COMMUNITY)
Admission: EM | Admit: 2012-09-18 | Discharge: 2012-09-18 | Disposition: A | Discharge status: Home / Self Care | Payer: Medicaid Other | Attending: Emergency Medicine | Admitting: Emergency Medicine

## 2012-09-18 DIAGNOSIS — K5900 Constipation, unspecified: Secondary | ICD-10-CM | POA: Insufficient documentation

## 2012-09-18 DIAGNOSIS — R110 Nausea: Secondary | ICD-10-CM | POA: Insufficient documentation

## 2012-09-18 DIAGNOSIS — F17200 Nicotine dependence, unspecified, uncomplicated: Secondary | ICD-10-CM | POA: Insufficient documentation

## 2012-09-18 DIAGNOSIS — R109 Unspecified abdominal pain: Secondary | ICD-10-CM | POA: Insufficient documentation

## 2012-09-18 DIAGNOSIS — Z79899 Other long term (current) drug therapy: Secondary | ICD-10-CM | POA: Insufficient documentation

## 2012-09-18 LAB — CBC WITH DIFFERENTIAL/PLATELET
HCT: 38 % — ABNORMAL LOW (ref 39.0–52.0)
Hemoglobin: 13.5 g/dL (ref 13.0–17.0)
Lymphs Abs: 1.4 10*3/uL (ref 0.7–4.0)
MCH: 30.2 pg (ref 26.0–34.0)
Monocytes Relative: 4 % (ref 3–12)
Neutro Abs: 7.8 10*3/uL — ABNORMAL HIGH (ref 1.7–7.7)
Neutrophils Relative %: 79 % — ABNORMAL HIGH (ref 43–77)
RBC: 4.47 MIL/uL (ref 4.22–5.81)

## 2012-09-18 LAB — BASIC METABOLIC PANEL
BUN: 8 mg/dL (ref 6–23)
Chloride: 102 mEq/L (ref 96–112)
Glucose, Bld: 107 mg/dL — ABNORMAL HIGH (ref 70–99)
Potassium: 3.7 mEq/L (ref 3.5–5.1)

## 2012-09-18 LAB — URINALYSIS, ROUTINE W REFLEX MICROSCOPIC
Bilirubin Urine: NEGATIVE
Glucose, UA: NEGATIVE mg/dL
Nitrite: NEGATIVE
Specific Gravity, Urine: 1.02 (ref 1.005–1.030)
pH: 6.5 (ref 5.0–8.0)

## 2012-09-18 MED ORDER — OXYCODONE-ACETAMINOPHEN 5-325 MG PO TABS
1.0000 | ORAL_TABLET | Freq: Once | ORAL | Status: AC
  Administered 2012-09-18: 1 via ORAL
  Filled 2012-09-18: qty 1

## 2012-09-18 MED ORDER — OXYCODONE-ACETAMINOPHEN 5-325 MG PO TABS: 1.0000 | ORAL_TABLET | ORAL | Status: DC | PRN

## 2012-09-18 NOTE — ED Provider Notes (Signed)
History  This chart was scribed for Joya Gaskins, MD by Bennett Scrape, ED Scribe. This patient was seen in room APA05/APA05 and the patient's care was started at 9:44 AM.  CSN: [id number] Arrival date & time 09/18/12  0918  First MD Initiated Contact with Patient 09/18/12 [phone]     Chief Complaint  Patient presents with  . Abdominal Pain    Patient is a 27 y.o. male presenting with abdominal pain. The history is provided by the patient. No language interpreter was used.  Abdominal Pain This is a chronic problem. The current episode started more than 1 week ago. The problem occurs daily. Progression since onset: waxing and waning. Associated symptoms include abdominal pain. Pertinent negatives include no chest pain and no shortness of breath. He has tried nothing for the symptoms.    HPI Comments: Jonathan Khan is a 27 y.o. male who presents to the Emergency Department complaining of chronic right abdominal pain that has gradually worsened over the past 2 days. He describes the pain as a sore ache that is non-radiating. He admits that he had a right inguinal hernia repair on Dec. 24th, 2014 of last year and has felt pain daily but at varying intensities. He reports mild constipation but states that his last BM was yesterday. He states that he has followed up with specialists for his pain with no improvement. He states that "they only send me home with pills, pills, pills". He reports associated nausea but denies fevers, hematochezia, emesis and CP as associated symptoms. Pt does not have a h/o chronic medical conditions.  PMH - none  Past Surgical History  Procedure Laterality Date  . Hernia repair  02/27/12    RIH   Family History  Problem Relation Age of Onset  . Hyperlipidemia Mother   . Hypertension Father    History  Substance Use Topics  . Smoking status: Current Every Day Smoker -- 0.50 packs/day    Types: Cigarettes  . Smokeless tobacco: Not on file  . Alcohol Use:  No     Comment: social    Review of Systems  Constitutional: Negative for fever.  Respiratory: Negative for shortness of breath.   Cardiovascular: Negative for chest pain.  Gastrointestinal: Positive for nausea, abdominal pain and constipation. Negative for vomiting and blood in stool.  Genitourinary: Negative for dysuria.  All other systems reviewed and are negative.    Allergies  Tramadol  Home Medications   Current Outpatient Rx  Name  Route  Sig  Dispense  Refill  . HYDROcodone-acetaminophen (NORCO/VICODIN) 5-325 MG per tablet      1 daily if needed for pain   14 tablet   0   . ibuprofen (ADVIL,MOTRIN) 800 MG tablet   Oral   Take 800 mg by mouth every 8 (eight) hours as needed. For pain          Triage Vitals: BP 140/76  Pulse 76  Temp(Src) 97.6 F (36.4 C) (Oral)  Resp 14  Ht 5\' 4"  (1.626 m)  Wt 145 lb (65.772 kg)  BMI 24.88 kg/m2  SpO2 100%  Physical Exam  Nursing note and vitals reviewed.  CONSTITUTIONAL: Well developed/well nourished HEAD: Normocephalic/atraumatic EYES: EOMI/PERRL, no icterus ENMT: Mucous membranes moist NECK: supple no meningeal signs SPINE:entire spine nontender CV: S1/S2 noted, no murmurs/rubs/gallops noted LUNGS: Lungs are clear to auscultation bilaterally, no apparent distress ABDOMEN: soft, non-tender, no rebound or guarding GU:no cva tenderness, no hernia, no testicular/scrotal tenderness, chaperone present NEURO: Pt is  awake/alert, moves all extremitiesx4 EXTREMITIES: pulses normal, full ROM SKIN: warm, color normal PSYCH: no abnormalities of mood noted  ED Course  Procedures  DIAGNOSTIC STUDIES: Oxygen Saturation is 100% on room air, normal by my interpretation.    COORDINATION OF CARE: 9:51 AM-Discussed treatment plan which includes pain medication, CBC panel, BMP and UA with pt at bedside and pt agreed to plan.   Lab workup unrevealing Pt well appearing He admits this has become a chronic issue Outpatient  referrals given MDM  Nursing notes including past medical history and social history reviewed and considered in documentation Labs/vital reviewed and considered   I personally performed the services described in this documentation, which was scribed in my presence. The recorded information has been reviewed and is accurate.      Joya Gaskins, MD 09/18/12 [phone]

## 2012-09-18 NOTE — ED Notes (Signed)
Pt reports had surgery for umbilical hernia this past Dec.  PT says his stomach has felt normal since.  Reports has been to the doctor several times but feels like they are "putting me off."  Pt says pain has gotten worse over the past 2 nights.  Reports nausea, no vomiting.  Pt says feels like has the flu.

## 2012-10-04 ENCOUNTER — Ambulatory Visit: Payer: Medicaid Other | Admitting: Gastroenterology

## 2012-10-30 ENCOUNTER — Ambulatory Visit: Payer: Medicaid Other | Admitting: Gastroenterology

## 2012-10-31 ENCOUNTER — Ambulatory Visit (INDEPENDENT_AMBULATORY_CARE_PROVIDER_SITE_OTHER): Payer: Medicaid Other | Admitting: Gastroenterology

## 2012-10-31 ENCOUNTER — Encounter: Payer: Self-pay | Admitting: Gastroenterology

## 2012-10-31 VITALS — BP 126/68 | HR 82 | Temp 98.2°F | Ht 68.0 in | Wt 139.4 lb

## 2012-10-31 DIAGNOSIS — R1012 Left upper quadrant pain: Secondary | ICD-10-CM

## 2012-10-31 DIAGNOSIS — R1013 Epigastric pain: Secondary | ICD-10-CM | POA: Insufficient documentation

## 2012-10-31 DIAGNOSIS — F5000 Anorexia nervosa, unspecified: Secondary | ICD-10-CM

## 2012-10-31 MED ORDER — OMEPRAZOLE 20 MG PO CPDR: 20.0000 mg | DELAYED_RELEASE_CAPSULE | Freq: Every day | ORAL | Status: DC

## 2012-10-31 MED ORDER — HYDROCODONE-ACETAMINOPHEN 5-325 MG PO TABS: 1.0000 | ORAL_TABLET | Freq: Four times a day (QID) | ORAL | Status: DC | PRN

## 2012-10-31 NOTE — Progress Notes (Signed)
Primary Care Physician:  Rudi Heap, MD  Primary Gastroenterologist:    Chief Complaint  Patient presents with  . Abdominal Pain  . Gastrophageal Reflux    HPI:  Jonathan Khan is a 27 y.o. male here for further evaluation of abdominal pain. Last evaluated for this pain in the emergency department on 09/18/2012. CBC, metabolic panel unremarkable. No imaging studies done. Patient states he's had this pain now for nearly one year. He had this pain prior to right inguinal hernia repair done in December of last year. States the pain never went away. Some days are better than others. Pain tends to be worse in the afternoons after prolonged activity. Mrs. a couple of days of work at a time and then Jonathan Khan feel better before happens again. Over the past several days he noted diminished appetite. This morning he vomited gastric secretions. No dysphagia, heartburn. BM once day. No straining. No brbpr, melena. He reports that he does not feel well. He has been self medicating with ibuprofen taking 4 at a time a couple times per day if needed. States he used to weigh 165 lb. today he weighs 139 pounds. Documented weights of 147 pounds in May 2014, 143lb 01/2012 per Epic. Pain is worse with activity. Unrelated to meals. Pain migrates at times. More recently has been epigastric/left upper quadrant. Previously was more right-sided. No fevers. No dysuria or hematuria. No melena rectal bleeding.   Current Outpatient Prescriptions  Medication Sig Dispense Refill  . ibuprofen (ADVIL,MOTRIN) 200 MG tablet Take 600 mg by mouth every 6 (six) hours as needed for pain.       No current facility-administered medications for this visit.    Allergies as of 10/31/2012 - Review Complete 10/31/2012  Allergen Reaction Noted  . Tramadol Nausea And Vomiting 11/22/2010    History reviewed. No pertinent past medical history.  Past Surgical History  Procedure Laterality Date  . Hernia repair  02/27/12    RIH    Family  History  Problem Relation Age of Onset  . Hyperlipidemia Mother   . Hypertension Father   . Colon cancer Neg Hx     History   Social History  . Marital Status: Single    Spouse Name: N/A    Number of Children: N/A  . Years of Education: N/A   Occupational History  . Not on file.   Social History Main Topics  . Smoking status: Current Every Day Smoker -- 0.50 packs/day    Types: Cigarettes  . Smokeless tobacco: Not on file  . Alcohol Use: No     Comment: 3 times in the last one year  . Drug Use: No  . Sexual Activity: Not on file   Other Topics Concern  . Not on file   Social History Narrative  . No narrative on file      ROS:  General: Negative for anorexia, fever, chills, fatigue, weakness. States he has lost from 165 pounds to 139 pounds her last nearly one year. Documented weight of 147 lb 07/2012. 143 pounds in November 2013 per EPIC. Eyes: Negative for vision changes.  ENT: Negative for hoarseness, difficulty swallowing , nasal congestion. CV: Negative for chest pain, angina, palpitations, dyspnea on exertion, peripheral edema.  Respiratory: Negative for dyspnea at rest, dyspnea on exertion, cough, sputum, wheezing.  GI: See history of present illness. GU:  Negative for dysuria, hematuria, urinary incontinence, urinary frequency, nocturnal urination.  MS: Negative for joint pain, low back pain.  Derm: Negative  for rash or itching.  Neuro: Negative for weakness, abnormal sensation, seizure, frequent headaches, memory loss, confusion.  Psych: Negative for anxiety, depression, suicidal ideation, hallucinations.  Endo: Negative for unusual weight change.  Heme: Negative for bruising or bleeding. Allergy: Negative for rash or hives.    Physical Examination:  BP 126/68  Pulse 82  Temp(Src) 98.2 F (36.8 C) (Oral)  Ht 5\' 8"  (1.727 m)  Wt 139 lb 6.4 oz (63.231 kg)  BMI 21.2 kg/m2   General: Well-nourished, well-developed in no acute distress. Accompanied by  girlfriend. Head: Normocephalic, atraumatic.   Eyes: Conjunctiva pink, no icterus. Mouth: Oropharyngeal mucosa moist and pink , no lesions erythema or exudate. Neck: Supple without thyromegaly, masses, or lymphadenopathy.  Lungs: Clear to auscultation bilaterally.  Heart: Regular rate and rhythm, no murmurs rubs or gallops.  Abdomen: Bowel sounds are normal, moderate epigastric and left upper quadrant tenderness, nondistended, no hepatosplenomegaly or masses, no abdominal bruits or    hernia , no rebound or guarding.   Rectal: not performed Extremities: No lower extremity edema. No clubbing or deformities.  Neuro: Alert and oriented x 4 , grossly normal neurologically.  Skin: Warm and dry, no rash or jaundice.   Psych: Alert and cooperative, normal mood and affect.  Labs: Lab Results  Component Value Date   WBC 9.8 09/18/2012   HGB 13.5 09/18/2012   HCT 38.0* 09/18/2012   MCV 85.0 09/18/2012   PLT 228 09/18/2012   Lab Results  Component Value Date   CREATININE 0.73 09/18/2012   BUN 8 09/18/2012   NA 135 09/18/2012   K 3.7 09/18/2012   CL 102 09/18/2012   CO2 25 09/18/2012     Imaging Studies: No results found.

## 2012-10-31 NOTE — Assessment & Plan Note (Signed)
27 year old gentleman with several month history of worsening abdominal pain. Initially he thought was related to right inguinal hernia but after surgery noted the pain did not improve. The pain has become more migratory towards the upper abdomen. Now in the epigastric/left upper quadrant region. He has had a single episode of emesis yesterday complains of anorexia and weight loss. Significant ibuprofen use. At this point he may have secondary issues from NSAID use which is contributing to his upper abdominal pain. Need to rule out gastritis or ulcer. Recommend EGD in the near future.  I have discussed the risks, alternatives, benefits with regards to but not limited to the risk of reaction to medication, bleeding, infection, perforation and the patient is agreeable to proceed. Written consent to be obtained.  If EGD is negative, next step would be CT of the abdomen and pelvis with contrast. One time RX of Vicodin given today.

## 2012-10-31 NOTE — Patient Instructions (Signed)
We have scheduled you for an upper endoscopy with Dr. Jena Gauss. Please see separate instructions.  Start omeprazole 20mg  daily. Prescription sent to your pharmacy.

## 2012-11-01 NOTE — Progress Notes (Signed)
CC'd to PCP 

## 2012-11-02 ENCOUNTER — Telehealth: Payer: Self-pay

## 2012-11-02 NOTE — Telephone Encounter (Signed)
Pt's mother called this morning said that he was scared to have the EGD done. That is way he called and canceled it Thursday.

## 2012-11-06 NOTE — Telephone Encounter (Signed)
If patient refuses to have EGD done, let's proceed with CT A/P with contrast to evaluate abdominal pain, weight loss.

## 2012-11-06 NOTE — Telephone Encounter (Signed)
Spoke with pts mom- she Jonathan Khan talk with him tonight and Jonathan Khan call back tomorrow

## 2012-11-07 NOTE — Telephone Encounter (Signed)
I Ivo wait to hear from -patient before scheduling ct scan

## 2012-11-14 ENCOUNTER — Telehealth: Payer: Self-pay | Admitting: Gastroenterology

## 2012-11-14 DIAGNOSIS — R634 Abnormal weight loss: Secondary | ICD-10-CM

## 2012-11-14 DIAGNOSIS — R109 Unspecified abdominal pain: Secondary | ICD-10-CM

## 2012-11-14 NOTE — Telephone Encounter (Signed)
Patient is scheduled for CT abd/pel on Friday Sept 12 at 1:45 and he is aware to go by and pick up contrast before

## 2012-11-14 NOTE — Telephone Encounter (Signed)
Spoke with pts mother- informed her that prilosec has refills at the pharmacy.  She is not sure why he is requesting refill on the hydrocodone, informed her that we usually dont refill hydrocodone but I could ask. she verbalized understanding.

## 2012-11-14 NOTE — Telephone Encounter (Signed)
Tried to call pt. Many rings and no answer. ( Refills were given on Prilosec with prescription).

## 2012-11-14 NOTE — Telephone Encounter (Signed)
Patient is asking for refill on Hydrocodone 5/325 and Prilosec called to his pharmacy, in the process of scheduling CT abd/pel please advise?

## 2012-11-15 NOTE — Telephone Encounter (Signed)
Pt is aware.  

## 2012-11-15 NOTE — Telephone Encounter (Signed)
Patient was informed at his office visit that this was a one time prescription for pain medication. Await CT findings.

## 2012-11-16 ENCOUNTER — Ambulatory Visit (HOSPITAL_COMMUNITY): Admission: RE | Admit: 2012-11-16 | Payer: Medicaid Other | Source: Ambulatory Visit

## 2012-11-16 ENCOUNTER — Telehealth: Payer: Self-pay | Admitting: Nurse Practitioner

## 2012-11-19 NOTE — Telephone Encounter (Signed)
Unable to reach patient by phone 

## 2013-03-14 ENCOUNTER — Other Ambulatory Visit: Payer: Self-pay | Admitting: Nurse Practitioner

## 2013-03-14 MED ORDER — OSELTAMIVIR PHOSPHATE 75 MG PO CAPS: 75.0000 mg | ORAL_CAPSULE | Freq: Every day | ORAL | Status: DC

## 2013-05-22 ENCOUNTER — Telehealth: Payer: Self-pay | Admitting: Family Medicine

## 2013-05-22 NOTE — Telephone Encounter (Signed)
Patient needs to be seen in our office first to determine cause of pain. Left message on voicemail to call back and schedule appt.

## 2013-05-23 ENCOUNTER — Telehealth: Payer: Self-pay | Admitting: Nurse Practitioner

## 2013-05-23 NOTE — Telephone Encounter (Signed)
appt scheduled

## 2013-05-23 NOTE — Telephone Encounter (Signed)
Use ibuprofen and acetominophen OTC in the meantime. Patient agreed.

## 2013-05-24 ENCOUNTER — Ambulatory Visit (INDEPENDENT_AMBULATORY_CARE_PROVIDER_SITE_OTHER): Payer: Medicaid Other | Admitting: Nurse Practitioner

## 2013-05-24 ENCOUNTER — Encounter: Payer: Self-pay | Admitting: Nurse Practitioner

## 2013-05-24 ENCOUNTER — Ambulatory Visit (INDEPENDENT_AMBULATORY_CARE_PROVIDER_SITE_OTHER): Payer: Medicaid Other

## 2013-05-24 VITALS — BP 131/77 | HR 104 | Temp 97.3°F | Ht 68.0 in | Wt 148.0 lb

## 2013-05-24 DIAGNOSIS — F32A Depression, unspecified: Secondary | ICD-10-CM

## 2013-05-24 DIAGNOSIS — R1012 Left upper quadrant pain: Secondary | ICD-10-CM

## 2013-05-24 DIAGNOSIS — F411 Generalized anxiety disorder: Secondary | ICD-10-CM

## 2013-05-24 DIAGNOSIS — F3289 Other specified depressive episodes: Secondary | ICD-10-CM

## 2013-05-24 DIAGNOSIS — F329 Major depressive disorder, single episode, unspecified: Secondary | ICD-10-CM

## 2013-05-24 LAB — POCT CBC
Granulocyte percent: 75.8 %G (ref 37–80)
HEMATOCRIT: 47.8 % (ref 43.5–53.7)
HEMOGLOBIN: 15.4 g/dL (ref 14.1–18.1)
LYMPH, POC: 1.6 (ref 0.6–3.4)
MCH: 28.6 pg (ref 27–31.2)
MCHC: 32.3 g/dL (ref 31.8–35.4)
MCV: 88.6 fL (ref 80–97)
MPV: 6.8 fL (ref 0–99.8)
POC Granulocyte: 6.4 (ref 2–6.9)
POC LYMPH PERCENT: 18.7 %L (ref 10–50)
Platelet Count, POC: 277 10*3/uL (ref 142–424)
RBC: 5.4 M/uL (ref 4.69–6.13)
RDW, POC: 12.9 %
WBC: 8.4 10*3/uL (ref 4.6–10.2)

## 2013-05-24 MED ORDER — ESCITALOPRAM OXALATE 10 MG PO TABS: 10.0000 mg | ORAL_TABLET | Freq: Every day | ORAL | Status: DC

## 2013-05-24 NOTE — Patient Instructions (Signed)
Stress Management Stress is a state of physical or mental tension that often results from changes in your life or normal routine. Some common causes of stress are:  Death of a loved one.  Injuries or severe illnesses.  Getting fired or changing jobs.  Moving into a new home. Other causes may be:  Sexual problems.  Business or financial losses.  Taking on a large debt.  Regular conflict with someone at home or at work.  Constant tiredness from lack of sleep. It is not just bad things that are stressful. It may be stressful to:  Win the lottery.  Get married.  Buy a new car. The amount of stress that can be easily tolerated varies from person to person. Changes generally cause stress, regardless of the types of change. Too much stress can affect your health. It may lead to physical or emotional problems. Too little stress (boredom) may also become stressful. SUGGESTIONS TO REDUCE STRESS:  Talk things over with your family and friends. It often is helpful to share your concerns and worries. If you feel your problem is serious, you may want to get help from a professional counselor.  Consider your problems one at a time instead of lumping them all together. Trying to take care of everything at once may seem impossible. List all the things you need to do and then start with the most important one. Set a goal to accomplish 2 or 3 things each day. If you expect to do too many in a single day you Jenesis naturally fail, causing you to feel even more stressed.  Do not use alcohol or drugs to relieve stress. Although you may feel better for a short time, they do not remove the problems that caused the stress. They can also be habit forming.  Exercise regularly - at least 3 times per week. Physical exercise can help to relieve that "uptight" feeling and Keanen relax you.  The shortest distance between despair and hope is often a good night's sleep.  Go to bed and get up on time allowing  yourself time for appointments without being rushed.  Take a short "time-out" period from any stressful situation that occurs during the day. Close your eyes and take some deep breaths. Starting with the muscles in your face, tense them, hold it for a few seconds, then relax. Repeat this with the muscles in your neck, shoulders, hand, stomach, back and legs.  Take good care of yourself. Eat a balanced diet and get plenty of rest.  Schedule time for having fun. Take a break from your daily routine to relax. HOME CARE INSTRUCTIONS   Call if you feel overwhelmed by your problems and feel you can no longer manage them on your own.  Return immediately if you feel like hurting yourself or someone else. Document Released: 08/17/2000 Document Revised: 05/16/2011 Document Reviewed: 10/16/2012 [employer] Patient Information 2014 [employer].  

## 2013-05-24 NOTE — Progress Notes (Signed)
Subjective:    Patient ID: Jonathan Khan, male    DOB: 1985-09-13, 28 y.o.   MRN: [id number]  Abdominal Pain This is a recurrent problem. The current episode started more than 1 year ago. The onset quality is gradual. The problem occurs constantly. The pain is located in the generalized abdominal region, LUQ and LLQ. The pain is at a severity of 7/10. The pain is moderate. The quality of the pain is aching and a sensation of fullness. The abdominal pain does not radiate. Associated symptoms include anorexia. Pertinent negatives include no constipation, diarrhea, dysuria, fever, hematuria, nausea, vomiting or weight loss. The pain is relieved by nothing. He has tried acetaminophen for the symptoms. The treatment provided no relief. His past medical history is significant for abdominal surgery.     Depression/Anxiety- Pt states that he can feel how bad his nerves are related to dealing with longterm pain.  States MD gave him xanax and oxycodone for as long as he was allowed after surgery and it was helpful.  See completed PHQ9.  States he has decreased energy and wants to sleep all the time.  He is tired of feeling sick.  Denies suicidal thoughts.   *  Review of Systems  Constitutional: Positive for activity change, appetite change and fatigue. Negative for fever and weight loss.  HENT: Negative.   Respiratory: Negative.   Cardiovascular: Negative.   Gastrointestinal: Positive for abdominal pain and anorexia. Negative for nausea, vomiting, diarrhea, constipation, blood in stool and abdominal distention.  Genitourinary: Negative for dysuria, hematuria, flank pain and testicular pain.  Hematological: Negative.   Psychiatric/Behavioral: Negative.   All other systems reviewed and are negative.       Objective:   Physical Exam  Constitutional: He is oriented to person, place, and time. He appears well-developed and well-nourished.  Cardiovascular: Normal rate, regular rhythm and normal heart  sounds.   Pulmonary/Chest: Effort normal and breath sounds normal.  Abdominal: Soft. Bowel sounds are normal. There is tenderness (diffuse soreness throughout).  Neurological: He is alert and oriented to person, place, and time.  Skin: Skin is warm and dry.  Psychiatric: He has a normal mood and affect. His behavior is normal. Judgment and thought content normal.   BP 131/77  Pulse 104  Temp(Src) 97.3 F (36.3 C) (Oral)  Ht 5\' 8"  (1.727 m)  Wt 148 lb (67.132 kg)  BMI 22.51 kg/m2 Results for orders placed in visit on 05/24/13  POCT CBC      Result Value Ref Range   WBC 8.4  4.6 - 10.2 K/uL   Lymph, poc 1.6  0.6 - 3.4   POC LYMPH PERCENT 18.7  10 - 50 %L   MID (cbc)    0 - 0.9   POC MID %    0 - 12 %M   POC Granulocyte 6.4  2 - 6.9   Granulocyte percent 75.8  37 - 80 %G   RBC 5.4  4.69 - 6.13 M/uL   Hemoglobin 15.4  14.1 - 18.1 g/dL   HCT, POC 40.947.8  81.143.5 - 53.7 %   MCV 88.6  80 - 97 fL   MCH, POC 28.6  27 - 31.2 pg   MCHC 32.3  31.8 - 35.4 g/dL   RDW, POC 91.412.9     Platelet Count, POC 277.0  142 - 424 K/uL   MPV 6.8  0 - 99.8 fL    Kub-normal-Preliminary reading by Jonathan FloorMary Alease Fait, FNP  [facility]  Assessment & Plan:  1. Abdominal pain, left upper quadrant Need to contact GI about the CT of abd that they ordered Patient asked for oxycodone for pain but I can not find anything to warrant that- willl not fill at this time - POCT CBC - DG Abd 1 View; Future  2. Depression Stress management - escitalopram (LEXAPRO) 10 MG tablet; Take 1 tablet (10 mg total) by mouth daily.  Dispense: 30 tablet; Refill: 3  3. GAD (generalized anxiety disorder) Patient specifically asked for xanax but i Tukker not do that at this time - escitalopram (LEXAPRO) 10 MG tablet; Take 1 tablet (10 mg total) by mouth daily.  Dispense: 30 tablet; Refill: 3 follow up in 1 month Jonathan Daphine Deutscher, FNP

## 2014-04-02 ENCOUNTER — Encounter (HOSPITAL_COMMUNITY): Payer: Self-pay | Admitting: *Deleted

## 2014-04-02 ENCOUNTER — Emergency Department (HOSPITAL_COMMUNITY)
Admission: EM | Admit: 2014-04-02 | Discharge: 2014-04-03 | Disposition: A | Discharge status: Home / Self Care | Payer: Medicaid Other | Attending: Emergency Medicine | Admitting: Emergency Medicine

## 2014-04-02 DIAGNOSIS — F1910 Other psychoactive substance abuse, uncomplicated: Secondary | ICD-10-CM

## 2014-04-02 DIAGNOSIS — F1210 Cannabis abuse, uncomplicated: Secondary | ICD-10-CM | POA: Insufficient documentation

## 2014-04-02 DIAGNOSIS — Z23 Encounter for immunization: Secondary | ICD-10-CM | POA: Insufficient documentation

## 2014-04-02 DIAGNOSIS — F1310 Sedative, hypnotic or anxiolytic abuse, uncomplicated: Secondary | ICD-10-CM | POA: Insufficient documentation

## 2014-04-02 DIAGNOSIS — M791 Myalgia: Secondary | ICD-10-CM | POA: Insufficient documentation

## 2014-04-02 DIAGNOSIS — Z720 Tobacco use: Secondary | ICD-10-CM | POA: Insufficient documentation

## 2014-04-02 DIAGNOSIS — F329 Major depressive disorder, single episode, unspecified: Secondary | ICD-10-CM | POA: Insufficient documentation

## 2014-04-02 DIAGNOSIS — F1110 Opioid abuse, uncomplicated: Secondary | ICD-10-CM | POA: Insufficient documentation

## 2014-04-02 DIAGNOSIS — R509 Fever, unspecified: Secondary | ICD-10-CM | POA: Insufficient documentation

## 2014-04-02 DIAGNOSIS — Z79899 Other long term (current) drug therapy: Secondary | ICD-10-CM | POA: Insufficient documentation

## 2014-04-02 NOTE — ED Notes (Signed)
Pt states "I need to get off drugs"pt states that he has been using pain pills for years and has been on Heroin for 8 months; pt states that he has also been using Marijuana, Cocaine and "anything else I can get a hold of"; pt states that "I have thoughts about suicide" but denies plan

## 2014-04-03 LAB — CBC
HEMATOCRIT: 38.2 % — AB (ref 39.0–52.0)
HEMOGLOBIN: 13.1 g/dL (ref 13.0–17.0)
MCH: 29.8 pg (ref 26.0–34.0)
MCHC: 34.3 g/dL (ref 30.0–36.0)
MCV: 87 fL (ref 78.0–100.0)
Platelets: 258 10*3/uL (ref 150–400)
RBC: 4.39 MIL/uL (ref 4.22–5.81)
RDW: 12.5 % (ref 11.5–15.5)
WBC: 8.9 10*3/uL (ref 4.0–10.5)

## 2014-04-03 LAB — COMPREHENSIVE METABOLIC PANEL
ALBUMIN: 4.2 g/dL (ref 3.5–5.2)
ALK PHOS: 75 U/L (ref 39–117)
ALT: 32 U/L (ref 0–53)
AST: 20 U/L (ref 0–37)
Anion gap: 7 (ref 5–15)
BUN: 15 mg/dL (ref 6–23)
CALCIUM: 8.7 mg/dL (ref 8.4–10.5)
CO2: 25 mmol/L (ref 19–32)
CREATININE: 0.67 mg/dL (ref 0.50–1.35)
Chloride: 105 mmol/L (ref 96–112)
GLUCOSE: 91 mg/dL (ref 70–99)
POTASSIUM: 3.8 mmol/L (ref 3.5–5.1)
Sodium: 137 mmol/L (ref 135–145)
Total Bilirubin: 0.5 mg/dL (ref 0.3–1.2)
Total Protein: 7.5 g/dL (ref 6.0–8.3)

## 2014-04-03 LAB — RAPID URINE DRUG SCREEN, HOSP PERFORMED
AMPHETAMINES: NOT DETECTED
BARBITURATES: NOT DETECTED
Benzodiazepines: POSITIVE — AB
Cocaine: NOT DETECTED
Opiates: POSITIVE — AB
TETRAHYDROCANNABINOL: POSITIVE — AB

## 2014-04-03 LAB — SALICYLATE LEVEL: Salicylate Lvl: <4 mg/dL (ref 2.8–20.0)

## 2014-04-03 LAB — ETHANOL: Alcohol, Ethyl (B): <5 mg/dL (ref 0–9)

## 2014-04-03 LAB — ACETAMINOPHEN LEVEL: Acetaminophen (Tylenol), Serum: <10 ug/mL — ABNORMAL LOW (ref 10–30)

## 2014-04-03 MED ORDER — CLONIDINE HCL 0.1 MG PO TABS
0.1000 mg | ORAL_TABLET | Freq: Once | ORAL | Status: AC
Start: 2014-04-03 — End: 2014-04-03
  Administered 2014-04-03: 0.1 mg via ORAL
  Filled 2014-04-03: qty 1

## 2014-04-03 MED ORDER — ONDANSETRON HCL 4 MG PO TABS: 4.0000 mg | ORAL_TABLET | Freq: Four times a day (QID) | ORAL | Status: AC

## 2014-04-03 MED ORDER — TETANUS-DIPHTH-ACELL PERTUSSIS 5-2.5-18.5 LF-MCG/0.5 IM SUSP
0.5000 mL | Freq: Once | INTRAMUSCULAR | Status: AC
  Administered 2014-04-03: 0.5 mL via INTRAMUSCULAR
  Filled 2014-04-03: qty 0.5

## 2014-04-03 MED ORDER — CLONIDINE HCL 0.2 MG PO TABS: 0.2000 mg | ORAL_TABLET | Freq: Two times a day (BID) | ORAL | Status: AC

## 2014-04-03 MED ORDER — ACETAMINOPHEN 325 MG PO TABS
650.0000 mg | ORAL_TABLET | Freq: Once | ORAL | Status: AC
  Administered 2014-04-03: 650 mg via ORAL
  Filled 2014-04-03: qty 2

## 2014-04-03 MED ORDER — IBUPROFEN 600 MG PO TABS: 600.0000 mg | ORAL_TABLET | Freq: Four times a day (QID) | ORAL | Status: AC | PRN

## 2014-04-03 MED ORDER — ONDANSETRON HCL 4 MG PO TABS
4.0000 mg | ORAL_TABLET | Freq: Once | ORAL | Status: AC
  Administered 2014-04-03: 4 mg via ORAL
  Filled 2014-04-03: qty 1

## 2014-04-03 NOTE — BH Assessment (Addendum)
Tele Assessment Note   Jonathan Khan is an 29 y.o. male who quit school in the 9th grade, is employed but, lives with his parents.  Pt was accompanied to the ED by his father and brothers. Pt reported that he "decided I needed help."  Pt denied Si, H, SH impulses and AVH.  Pt admitted SI at times but explained that it "seldom gets to the planning stages."  Pt expressed remorse and regret over his "heroin addiction."  Pt reported that he had been on ADHD medication until about 7-8 months ago when he lost his insurance. Pt reported not having had IP or OP treatment for SA or H in the past.  Pt denied sexual, physical or emotional abuse and denied being abusive to others. Pt has only two symptoms of depression: periods of tearfulness and feeling irritable at times.   Pt's UDS results included testing positive for Opiates. Benzodiazepines and Cannabis. ETOH was < 5.    Pt was pleasant, cooperative and articulate. Pt was dressed in scrubs and lying in his hospital bed during the assessment.  His speech, motor activity and eye contact were unremarkable.  His mood was sad which matched his blunted affect.  Pt's thought processes were logical and coherent and pt's insight seemed partially impaired.   Axis I: 304.00 Opioid Use Disorder; 304.30 Cannabis Use Disorder;  Axis II: Deferred Axis III: History reviewed. No pertinent past medical history. Axis IV: other psychosocial or environmental problems, problems related to social environment and problems with primary support group Axis V: 11-20 some danger of hurting self or others possible OR occasionally fails to maintain minimal personal hygiene OR gross impairment in communication  Past Medical History: History reviewed. No pertinent past medical history.  Past Surgical History  Procedure Laterality Date  . Hernia repair  02/27/12    RIH    Family History:  Family History  Problem Relation Age of Onset  . Hyperlipidemia Mother   . Hypertension  Father   . Colon cancer Neg Hx     Social History:  reports that he has been smoking Cigarettes.  He has been smoking about 1.00 pack per day. He does not have any smokeless tobacco history on file. He reports that he uses illicit drugs (Cocaine and Marijuana). He reports that he does not drink alcohol.  Additional Social History:  Alcohol / Drug Use Prescriptions: See PTA list History of alcohol / drug use?: Yes Longest period of sobriety (when/how long): 1-2 weeks Negative Consequences of Use: Financial, Legal, Personal relationships, Work / School Substance #1 Name of Substance 1: "pain pills" 1 - Age of First Use: 21 1 - Amount (size/oz): pt unsure 1 - Frequency: daily if no heroin 1 - Duration: 8 years 1 - Last Use / Amount: not sure Substance #2 Name of Substance 2: Heroin 2 - Age of First Use: 28 2 - Amount (size/oz): $40 worth 2 - Frequency: everyother day 2 - Duration: 6-8 months ago 2 - Last Use / Amount: yesterday Substance #3 Name of Substance 3: Marijuana 3 - Age of First Use: 19 3 - Amount (size/oz): 1 bowl 3 - Frequency: 1 x week 3 - Duration: 10 years 3 - Last Use / Amount: day before yesterday Substance #4 Name of Substance 4: Cocaine 4 - Age of First Use: 23 4 - Amount (size/oz): $200 worth 4 - Frequency: once or twice amonth - used socially 4 - Duration: years 4 - Last Use / Amount: 1 week  ago  CIWA: CIWA-Ar BP: 135/79 mmHg Pulse Rate: 83 COWS:    PATIENT STRENGTHS: (choose at least two) Ability for insight Average or above average intelligence Supportive family/friends  Allergies:  Allergies  Allergen Reactions  . Tramadol Nausea And Vomiting    Home Medications:  (Not in a hospital admission)  OB/GYN Status:  No LMP for male patient.  General Assessment Data Location of Assessment: [facility] ED ACT Assessment:  (na) Is this a Tele or Face-to-Face Assessment?: Face-to-Face Is this an Initial Assessment or a Re-assessment for this  encounter?: Initial Assessment Living Arrangements: Parent Can pt return to current living arrangement?: Yes Admission Status: Voluntary Is patient capable of signing voluntary admission?: Yes Transfer from: Home Referral Source: Self/Family/Friend  Medical Screening Exam [facility] Walk-in ONLY) Medical Exam completed: Yes  [facility] Crisis Care Plan Living Arrangements: Parent Name of Psychiatrist: none Name of Therapist: none  Education Status Is patient currently in school?: No Current Grade: na Highest grade of school patient has completed: 8 Name of school: na Contact person: na  Risk to self with the past 6 months Suicidal Ideation: No-Not Currently/Within Last 6 Months (denies) Suicidal Intent: No-Not Currently/Within Last 6 Months (denies) Is patient at risk for suicide?: No (denies) Suicidal Plan?: No Access to Means: No (denies accessto firearms) What has been your use of drugs/alcohol within the last 12 months?: almost daily Previous Attempts/Gestures: No (denies) How many times?: 0 Other Self Harm Risks: no Triggers for Past Attempts: Unpredictable Intentional Self Injurious Behavior: None (denies) Family Suicide History: Unknown Recent stressful life event(s):  (pt says "not really") Persecutory voices/beliefs?: No (denies) Depression: No Depression Symptoms: Tearfulness, Feeling angry/irritable Substance abuse history and/or treatment for substance abuse?: Yes Suicide prevention information given to non-admitted patients: Not applicable  Risk to Others within the past 6 months Homicidal Ideation: No (denies) Thoughts of Harm to Others: No (denies) Current Homicidal Intent: No (denies) Current Homicidal Plan: No Access to Homicidal Means: No (denies) Identified Victim: na History of harm to others?: No (denies) Assessment of Violence: None Noted Violent Behavior Description: na Does patient have access to weapons?: No (denies) Criminal Charges Pending?:  No Does patient have a court date: Yes Court Date: 04/04/14 (Child Support Court date)  Psychosis Hallucinations: None noted (denies) Delusions: None noted  Mental Status Report Appear/Hygiene: In scrubs, Unremarkable Eye Contact: Good Motor Activity: Unremarkable Speech: Logical/coherent, Unremarkable Level of Consciousness: Alert, Quiet/awake Mood: Pleasant Affect: Blunted Anxiety Level: Minimal Thought Processes: Coherent, Relevant Judgement: Partial Orientation: Person, Place, Time, Situation Obsessive Compulsive Thoughts/Behaviors: Unable to Assess  Cognitive Functioning Concentration: Good Memory: Recent Intact, Remote Intact IQ: Average Insight: Fair Impulse Control: Unable to Assess Appetite: Good Weight Loss: 0 Weight Gain: 0 Sleep: No Change Total Hours of Sleep: 6 Vegetative Symptoms: Unable to Assess  ADLScreening [facility] Assessment Services) Patient's cognitive ability adequate to safely complete daily activities?: Yes Patient able to express need for assistance with ADLs?: Yes Independently performs ADLs?: Yes (appropriate for developmental age)  Prior Inpatient Therapy Prior Inpatient Therapy: No Prior Therapy Dates: na Prior Therapy Facilty/Provider(s): na Reason for Treatment: na  Prior Outpatient Therapy Prior Outpatient Therapy: No Prior Therapy Dates: na Prior Therapy Facilty/Provider(s): na Reason for Treatment: na  ADL Screening (condition at time of admission) Patient's cognitive ability adequate to safely complete daily activities?: Yes Patient able to express need for assistance with ADLs?: Yes Independently performs ADLs?: Yes (appropriate for developmental age)  Advance Directives (For Healthcare) Does patient have an advance directive?: No Would patient like information on creating an advanced directive?: No - patient declined information    Additional Information 1:1 In Past 12 Months?: No CIRT Risk:  No Elopement Risk: No Does patient have medical clearance?: Yes     Disposition:  Disposition Initial Assessment Completed for this Encounter: Yes Disposition of Patient: Other dispositions (pending review with [facility] Ext[facility]) Other disposition(s): Other (Comment)  Per Donell Sievert, PA: does not meet IP criteria. Recommend Discharge with Resource information regarding OP detox/drug treatment   Spoke to Dr. Norlene Campbell at [facility]:  Advised of recommendation.  She agreed.  Spoke to Margaret, RN/WLED:  Advised of plan to discharge with [facility] to follow-up.  This assessor gave pt Resource Listings for Substance Abuse Treatment (Intensive Day and Residential), Psychiatrists and Therapists (with Mobile Crisis Teams listed also) and Outpatient Psychiatry and Counsleing.  Beryle Flock, MS, [facility], [facility] Triage Specialist [facility]  04/03/2014 2:25 AM

## 2014-04-03 NOTE — Discharge Instructions (Signed)
Polysubstance Abuse °When people abuse more than one drug or type of drug it is called polysubstance or polydrug abuse. For example, many smokers also drink alcohol. This is one form of polydrug abuse. Polydrug abuse also refers to the use of a drug to counteract an unpleasant effect produced by another drug. It may also be used to help with withdrawal from another drug. People who take stimulants may become agitated. Sometimes this agitation is countered with a tranquilizer. This helps protect against the unpleasant side effects. Polydrug abuse also refers to the use of different drugs at the same time.  °Anytime drug use is interfering with normal living activities, it has become abuse. This includes problems with family and friends. Psychological dependence has developed when your mind tells you that the drug is needed. This is usually followed by physical dependence which has developed when continuing increases of drug are required to get the same feeling or "high". This is known as addiction or chemical dependency. A person's risk is much higher if there is a history of chemical dependency in the family. °SIGNS OF CHEMICAL DEPENDENCY °· You have been told by friends or family that drugs have become a problem. °· You fight when using drugs. °· You are having blackouts (not remembering what you do while using). °· You feel sick from using drugs but continue using. °· You lie about use or amounts of drugs (chemicals) used. °· You need chemicals to get you going. °· You are suffering in work performance or in school because of drug use. °· You get sick from use of drugs but continue to use anyway. °· You need drugs to relate to people or feel comfortable in social situations. °· You use drugs to forget problems. °"Yes" answered to any of the above signs of chemical dependency indicates there are problems. The longer the use of drugs continues, the greater the problems Jonathan Khan become. °If there is a family history of  drug or alcohol use, it is best not to experiment with these drugs. Continual use leads to tolerance. After tolerance develops more of the drug is needed to get the same feeling. This is followed by addiction. With addiction, drugs become the most important part of life. It becomes more important to take drugs than participate in the other usual activities of life. This includes relating to friends and family. Addiction is followed by dependency. Dependency is a condition where drugs are now needed not just to get high, but to feel normal. °Addiction cannot be cured but it can be stopped. This often requires outside help and the care of professionals. Treatment centers are listed in the yellow pages under: Cocaine, Narcotics, and Alcoholics Anonymous. Most hospitals and clinics can refer you to a specialized care center. Talk to your caregiver if you need help. °Document Released: 10/13/2004 Document Revised: 05/16/2011 Document Reviewed: 02/21/2005 °ExitCare® Patient Information ©2015 [employer]. This information is not intended to replace advice given to you by your health care provider. Make sure you discuss any questions you have with your health care provider. ° ° ° °Emergency Department Resource Guide °1) Find a Doctor and Pay Out of Pocket °Although you won't have to find out who is covered by your insurance plan, it is a good idea to ask around and get recommendations. You Jonathan Khan then need to call the office and see if the doctor you have chosen Jonathan Khan accept you as a new patient and what types of options they offer for patients who   are self-pay. Some doctors offer discounts or Jonathan Khan set up payment plans for their patients who do not have insurance, but you Jonathan Khan need to ask so you aren't surprised when you get to your appointment. ° °2) Contact Your Local Health Department °Not all health departments have doctors that can see patients for sick visits, but many do, so it is worth a call to see if yours does. If  you don't know where your local health department is, you can check in your phone book. The CDC also has a tool to help you locate your state's health department, and many state websites also have listings of all of their local health departments. ° °3) Find a Walk-in Clinic °If your illness is not likely to be very severe or complicated, you may want to try a walk in clinic. These are popping up all over the country in pharmacies, drugstores, and shopping centers. They're usually staffed by nurse practitioners or physician assistants that have been trained to treat common illnesses and complaints. They're usually fairly quick and inexpensive. However, if you have serious medical issues or chronic medical problems, these are probably not your best option. ° °No Primary Care Doctor: °- Call [facility] at  832-8000 - they can help you locate a primary care doctor that  accepts your insurance, provides certain services, etc. °- Physician Referral Service- 1-800-533-3463 ° °Chronic Pain Problems: °Organization         Address  Phone   Notes  ° [facility]  (336) 297-2271 Patients need to be referred by their primary care doctor.  ° °Medication Assistance: °Organization         Address  Phone   Notes  °[place] [facility] [address] °[place], [place] [address] (336) 641-8030 --Must be a resident of [place] °-- Must have NO insurance coverage whatsoever (no Medicaid/ Medicare, etc.) °-- The pt. MUST have a primary care doctor that directs their care regularly and follows them in the community °  °[facility]  (866) 331-1348   °[employer]  (888) 892-1162   ° °Agencies that provide inexpensive medical care: °Organization         Address  Phone   Notes  °[facility]  (336) 832-8035   °[facility]    (336) 832-7272   °[facility] [address] °[place], [place] [address] (336) 832-4777   °[facility] [address], °[place] (336) 271-4999   °[facility]    (336) 373-0678   °[facility]    (336) 272-1050   °[facility] ° [address],  Phone:  (336) 832-4444, Fax:  (336) 832-4440 Hours of Operation:  9 am - 6 pm, M-F.  Also accepts Medicaid/Medicare and self-pay.  °[facility] ° [address],  Phone: (336) 832-3150, Fax: (336) 832-3151. Hours of Operation:  8:30 am - 5:30 pm, M-F.  Also accepts Medicaid and self-pay.  °[facility] [place] [address], [place] Phone: (336) 878-6027   °[facility] [address], [place], [place] (336)723-1848, Ext. 123 Mondays & Thursdays: 7-9 AM.  First 15 patients are seen on a first come, first serve basis. °  ° °Medicaid-accepting [place] Providers: ° °Organization         Address  Phone   Notes  °[facility] [address], [place] (336) 641-2100 Also accepts self-pay patients.  °[facility] [address], [place] ° (336) 856-9996   °[facility] [address], [place] (336) 288-8857   °[facility] [address] [place] [address], [place] (336) 299-7000   °Veita Bland [address], [place]  ° (336) 373-1557 Only accepts [place] Access Medicaid patients after they have their name applied to their card.  ° °Self-Pay (no insurance) in [place]: ° °Organization         Address  Phone   Notes  °Sickle Cell Patients, [facility] [address], [place] (336) 832-1970   °[facility] [address], [place] (336) 832-4400   °[facility] ° [address], [place] (336) 992-4800   °[facility]/Dr. Osei-Bonsu ° [address] [place] [address], [place] or [address], [place] (336) 841-8500  Phone number for both [place] and [place] locations is the same.  °[facility] [address], [place] (336) 299-0000   °[facility] [address] [place] [address], [place] or [address] (336) 852-7530 °(336) 878-2260   °[facility] [address], [place] (336) 350-1642, phone; (336) 294-5005, fax Sees patients 1st and 3rd Saturday of every month.  Must not qualify for public or private insurance (i.e. Medicaid, Medicare, [place] Health Choice, Veterans' Benefits) • Household income should be no more than 200% of the poverty level •The clinic cannot treat you if you are pregnant or think you are pregnant • Sexually transmitted diseases are not treated at the clinic.  ° ° °Dental Care: °Organization         Address  Phone  Notes  °[place] [facility] [address], [place] (336) 641-6152 Accepts children up to age 21 who are enrolled in Medicaid or [place] Health Choice; pregnant women with a Medicaid card; and children who have applied for Medicaid or [place] Health Choice, but were declined, whose parents can pay a reduced fee at time of service.  °[facility] [place]  [address], [place] (336) 641-7733 Accepts children up to age 21 who are enrolled in Medicaid or [place] Health Choice; pregnant women with a Medicaid card; and children who have applied for Medicaid or [place] Health Choice, but were declined, whose parents can pay a reduced fee at time of service.  °[facility] ° [address], [place] (336) 641-4533 Patients are seen by appointment only. Walk-ins are not accepted. [facility] see patients 18 years of age and older. °Monday - Tuesday (8am-5pm) °Most Wednesdays (8:30-5pm) °$30 per visit, cash only  °[facility] ° [address], [place] (336) 641-4533 Patients are seen by appointment  only. Walk-ins are not accepted. [facility] see patients 18 years of age and older. °One Wednesday Evening (Monthly: Volunteer Based).  $30 per visit, cash only  °[facility]  (919) 537-3737 for adults; Children under age 4, call Graduate Pediatric Dentistry at (919) [phone]. Children aged 4-14, please call (919) [phone] to request a pediatric application. ° Dental services are provided in all areas of dental care including fillings, crowns and bridges, complete and partial dentures, implants, gum treatment, root canals,   and extractions. Preventive care is also provided. Treatment is provided to both adults and children. °Patients are selected via a lottery and there is often a waiting list. °  °[facility] [address], °[place] ° (336) 763-8833 [email or web address] °  °[facility] [address], [place], [place] (336)723-1848, Ext. 123 Second and Fourth Thursday of each month, opens at 6:30 AM; Clinic ends at 9 AM.  Patients are seen on a first-come first-served basis, and a limited number are seen during each clinic.  ° °[facility] ° [address], [place], [place] (336) 723-7904   Eligibility Requirements °You must have lived in [place], [place], or [place] counties for at least the last three months. °  You cannot be eligible for state or federal sponsored healthcare insurance, including Veterans Administration, Medicaid, or Medicare. °  You generally cannot be eligible for healthcare insurance through your employer.  °  How to apply: °Eligibility screenings are held every Tuesday and Wednesday afternoon from 1:00 pm until 4:00 pm. You do not need an appointment for the interview!  °[facility] [address], [place], [place] 336-631-2330   °[facility]  336-342-8273   °[facility]  336-703-3100   °[facility]  336-570-6415   ° °Behavioral Health  Resources in the Community: °Intensive Outpatient Programs °Organization         Address  Phone  Notes  °[facility] [address], [place], [place] 336-878-6098   °[facility] [address], [place], [place] 336-832-9800   °[facility] [address], [place], [place] ° 336-882-2125   °[facility] [address],  °[place], [place] 1-800-853-5163 or 336-641-4981   °Substance Abuse Resources °Organization         Address  Phone  Notes  °[facility]  336-882-2125   °[facility]  336-784-9470   °[facility]  336-285-9073   °[facility]  336-845-3988   °Residential & Outpatient Substance Abuse Program  1-800-659-3381   °Psychological Services °Organization         Address  Phone  Notes  °[facility]  336- 832-9600   °[facility]  336- 378-7881   °[facility] [address], [place] 1-800-853-5163 or 336-641-4981   ° °Mobile Crisis Teams °Organization         Address  Phone  Notes  °[facility], Mobile Crisis Care Unit  1-877-626-1772   °Assertive °[facility] ° [address] [place], [place] 336-834-9664   °Sharon DeEsch [address] °[place] 336-554-5454   ° °Self-Help/Support Groups °Organization         Address  Phone             Notes  °[facility] - variety of support groups  336- 373-1402 Call for more information  °Narcotics Anonymous (NA), [facility] [address], °[place]  2 meetings at this location  ° °Residential Treatment Programs °Organization         Address  Phone  Notes  °[facility] [address],    ° [place]  1-866-801-8205   °[facility] ° [address], [place], [place] 704-293-8524   °[facility] [address], [place] 336-845-3988 Admissions: 8am-3pm M-F  °[facility] [address]    °[place], [place] 336-841-1104   °[facility] [address], [place], [place] 336-379-7146   °[facility] [address]  °[place], [place] 336-285-9073   °[facility] Intensive Outpatient [address], [place], [place] 336-852-3033   °[facility] [address]  °[place], [place] 1-877-615-2722 or 336-784-9470   °[facility] [address] [place], [place] 336-227-7417 Accepts Medicaid  °[facility] [address]  °[place] 1-800-659-3381 Substance Abuse/Addiction Treatment  ° °[place] Behavior[facility] Health Resources °Organization         Address  Phone  Notes  °[facility]  (888) 581-9988   °Julie Brannon, PhD [address] [place], [place]   (336) 349-5553 or (336) 951-0000   °[facility]   [address] °[place], [place] (336) 349-4454   °[facility] [address], [place], [place] (336) 342-8316 Insurance/Medicaid/sponsorship through [facility]  °[facility] [address]                                    [place], [place] (336) 342-8316 Therapy/tele-psych/case  °[facility] [address]  ° [place], [place] (336) 349-2233    °Dr. Arfeen  (336) 349-4544   °[facility] [place]  [employer] [place] [facility] 1) [address], [place] °2) [address], [place] °3)  [address], [place] (336) 349-3220 °(336) 342-7768 ° °(336) 342-8140   °[place] [facility] (336) 342-1394 or (336) 342-3537 (After Hours)    ° ° ° °

## 2014-04-03 NOTE — ED Notes (Addendum)
Pt presents with complaint of SI, no specific plan and Heroin abuse, last used yesterday morning at 8am.  Denies HI or AV hallucinations, denies feeling hopeless.  Pt reports he has never been through Detox in the past.  Reports diagnosed with Anxiety in the past.  Pt calm & cooperative.  Yang monitor for safety.

## 2014-04-03 NOTE — ED Provider Notes (Signed)
CSN: [id number]     Arrival date & time 04/02/14  2306 History   First MD Initiated Contact with Patient 04/02/14 2358     Chief Complaint  Patient presents with  . detoxification   . Suicidal    (Consider location/radiation/quality/duration/timing/severity/associated sxs/prior Treatment) HPI Comments: Patient is a 29 year old male with no significant past medical history who presents to the emergency department for further evaluation of his substance abuse. Patient states that he has been using heroin IV for 8 months. He states that he has also been using various pain pills and Xanax as well as cocaine and marijuana. Patient does endorse suicidal thoughts in the past. He has no thoughts or plan at present. He denies any alcohol use or homicidal ideations. He has never sought treatment or detox before. Last use at 0800 on 04/01/14.  The history is provided by the patient. No language interpreter was used.    History reviewed. No pertinent past medical history. Past Surgical History  Procedure Laterality Date  . Hernia repair  02/27/12    RIH   Family History  Problem Relation Age of Onset  . Hyperlipidemia Mother   . Hypertension Father   . Colon cancer Neg Hx    History  Substance Use Topics  . Smoking status: Current Every Day Smoker -- 1.00 packs/day    Types: Cigarettes  . Smokeless tobacco: Not on file  . Alcohol Use: No     Comment: 3 times in the last one year    Review of Systems  Constitutional: Positive for chills.  Musculoskeletal: Positive for myalgias.  Psychiatric/Behavioral: Positive for behavioral problems.  All other systems reviewed and are negative.   Allergies  Tramadol  Home Medications   Prior to Admission medications   Medication Sig Start Date End Date Taking? Authorizing Provider  cloNIDine (CATAPRES) 0.2 MG tablet Take 1 tablet (0.2 mg total) by mouth 2 (two) times daily. 04/03/14   Antony Madura, PA-C  escitalopram (LEXAPRO) 10 MG tablet Take  1 tablet (10 mg total) by mouth daily. Patient not taking: Reported on 04/03/2014 05/24/13   Mary-Margaret Daphine Deutscher, FNP  ibuprofen (ADVIL,MOTRIN) 600 MG tablet Take 1 tablet (600 mg total) by mouth every 6 (six) hours as needed. 04/03/14   Antony Madura, PA-C  omeprazole (PRILOSEC) 20 MG capsule Take 1 capsule (20 mg total) by mouth daily before breakfast. Patient not taking: Reported on 04/03/2014 10/31/12   Tiffany Kocher, PA-C  ondansetron (ZOFRAN) 4 MG tablet Take 1 tablet (4 mg total) by mouth every 6 (six) hours. 04/03/14   Antony Madura, PA-C   BP 135/79 mmHg  Pulse 83  Temp(Src) 98.2 F (36.8 C) (Oral)  Resp 18  Ht  (1.727 m)  Wt 146 lb (66.225 kg)  BMI 22.20 kg/m2  SpO2 99%   Physical Exam  Constitutional: He is oriented to person, place, and time. He appears well-developed and well-nourished. No distress.  Nontoxic/nonseptic appearing  HENT:  Head: Normocephalic and atraumatic.  Eyes: Conjunctivae and EOM are normal. No scleral icterus.  Neck: Normal range of motion.  Pulmonary/Chest: Effort normal. No respiratory distress.  Respirations even and unlabored  Musculoskeletal: Normal range of motion.  Neurological: He is alert and oriented to person, place, and time. He exhibits normal muscle tone. Coordination normal.  Skin: Skin is warm and dry. No rash noted. He is not diaphoretic. No erythema. No pallor.  Psychiatric: His speech is normal. He is withdrawn. He exhibits a depressed mood. He expresses no  homicidal and no suicidal ideation. He expresses no suicidal plans and no homicidal plans.  Nursing note and vitals reviewed.   ED Course  Procedures (including critical care time) Labs Review Labs Reviewed  ACETAMINOPHEN LEVEL - Abnormal; Notable for the following:    Acetaminophen (Tylenol), Serum <10.0 (*)    All other components within normal limits  CBC - Abnormal; Notable for the following:    HCT 38.2 (*)    All other components within normal limits  URINE RAPID  DRUG SCREEN (HOSP PERFORMED) - Abnormal; Notable for the following:    Opiates POSITIVE (*)    Benzodiazepines POSITIVE (*)    Tetrahydrocannabinol POSITIVE (*)    All other components within normal limits  COMPREHENSIVE METABOLIC PANEL  ETHANOL  SALICYLATE LEVEL  HIV ANTIBODY (ROUTINE TESTING)    Imaging Review No results found.   EKG Interpretation None      MDM   Final diagnoses:  Polysubstance abuse    [age] year old male presents to the emergency department for further evaluation of polysubstance abuse. He states that he primarily uses IV heroin. He endorses thoughts of suicide in the past, but denies any at present. He does appear slightly depressed. He is complaining of some mild symptoms consistent with withdrawal from opioids. Patient's labs reviewed. He has been medically cleared. TTS evaluated the patient and believe he is stable for outpatient management/detox. Resource guide provided at discharge with list of outpatient services.   Filed Vitals:   04/02/14 2342  BP: 135/79  Pulse: 83  Temp: 98.2 F (36.8 C)  TempSrc: Oral  Resp: 18  Height: 5\' 8"  (1.727 m)  Weight: 146 lb (66.225 kg)  SpO2: 99%     Antony MaduraKelly Niall Illes, PA-C 04/03/14 [id number]  Olivia Mackielga M Otter, MD 04/03/14 [phone]

## 2014-04-04 ENCOUNTER — Ambulatory Visit: Payer: Medicaid Other | Admitting: Family Medicine

## 2014-04-04 LAB — HIV ANTIBODY (ROUTINE TESTING W REFLEX): HIV SCREEN 4TH GENERATION: NONREACTIVE

## 2014-06-23 ENCOUNTER — Encounter (HOSPITAL_COMMUNITY): Payer: Self-pay | Admitting: Emergency Medicine

## 2014-06-23 ENCOUNTER — Emergency Department (HOSPITAL_COMMUNITY): Payer: Medicaid Other

## 2014-06-23 ENCOUNTER — Emergency Department (HOSPITAL_COMMUNITY)
Admission: EM | Admit: 2014-06-23 | Discharge: 2014-06-23 | Disposition: A | Discharge status: Home / Self Care | Payer: Medicaid Other | Attending: Emergency Medicine | Admitting: Emergency Medicine

## 2014-06-23 DIAGNOSIS — Y9289 Other specified places as the place of occurrence of the external cause: Secondary | ICD-10-CM | POA: Insufficient documentation

## 2014-06-23 DIAGNOSIS — S39012A Strain of muscle, fascia and tendon of lower back, initial encounter: Secondary | ICD-10-CM | POA: Insufficient documentation

## 2014-06-23 DIAGNOSIS — Y9389 Activity, other specified: Secondary | ICD-10-CM | POA: Insufficient documentation

## 2014-06-23 DIAGNOSIS — X58XXXA Exposure to other specified factors, initial encounter: Secondary | ICD-10-CM | POA: Insufficient documentation

## 2014-06-23 DIAGNOSIS — Z720 Tobacco use: Secondary | ICD-10-CM | POA: Insufficient documentation

## 2014-06-23 DIAGNOSIS — Z79899 Other long term (current) drug therapy: Secondary | ICD-10-CM | POA: Insufficient documentation

## 2014-06-23 DIAGNOSIS — S0001XA Abrasion of scalp, initial encounter: Secondary | ICD-10-CM | POA: Insufficient documentation

## 2014-06-23 DIAGNOSIS — Y990 Civilian activity done for income or pay: Secondary | ICD-10-CM | POA: Insufficient documentation

## 2014-06-23 DIAGNOSIS — M549 Dorsalgia, unspecified: Secondary | ICD-10-CM

## 2014-06-23 MED ORDER — CYCLOBENZAPRINE HCL 10 MG PO TABS: 5.0000 mg | ORAL_TABLET | Freq: Every day | ORAL | Status: AC

## 2014-06-23 MED ORDER — OXYCODONE-ACETAMINOPHEN 5-325 MG PO TABS
1.0000 | ORAL_TABLET | Freq: Once | ORAL | Status: AC
Start: 2014-06-23 — End: 2014-06-23
  Administered 2014-06-23: 1 via ORAL
  Filled 2014-06-23: qty 1

## 2014-06-23 NOTE — ED Notes (Signed)
Pt c/o lower back pain after injuring at work today

## 2014-06-23 NOTE — Discharge Instructions (Signed)
Please call your doctor for a followup appointment within 24-48 hours. When you talk to your doctor please let them know that you were seen in the emergency department and have them acquire all of your records so that they can discuss the findings with you and formulate a treatment plan to fully care for your new and ongoing problems. Please follow-up with her primary care provider Please follow-up with orthopedics Please avoid any physical strenuous activity, please no heavy lifting for the next couple of days Please take medications as prescribed-Flexeril some muscle relaxer and can lead to drowsiness-please no drinking alcohol, driving, operating any heavy machinery while taking the medication. Please apply warm compressions and massage Please acquire a back brace Please continue to monitor symptoms closely and if symptoms are to worsen or change (fever greater than 101, chills, sweating, nausea, vomiting, chest pain, shortness of breathe, difficulty breathing, weakness, numbness, tingling, worsening or changes to pain pattern, fall, injury, loss of sensation, inability to control urine or bowel movements) please report back to the Emergency Department immediately.   Lumbosacral Strain Lumbosacral strain is a strain of any of the parts that make up your lumbosacral vertebrae. Your lumbosacral vertebrae are the bones that make up the lower third of your backbone. Your lumbosacral vertebrae are held together by muscles and tough, fibrous tissue (ligaments).  CAUSES  A sudden blow to your back can cause lumbosacral strain. Also, anything that causes an excessive stretch of the muscles in the low back can cause this strain. This is typically seen when people exert themselves strenuously, fall, lift heavy objects, bend, or crouch repeatedly. RISK FACTORS  Physically demanding work.  Participation in pushing or pulling sports or sports that require a sudden twist of the back (tennis, golf,  baseball).  Weight lifting.  Excessive lower back curvature.  Forward-tilted pelvis.  Weak back or abdominal muscles or both.  Tight hamstrings. SIGNS AND SYMPTOMS  Lumbosacral strain may cause pain in the area of your injury or pain that moves (radiates) down your leg.  DIAGNOSIS Your health care provider can often diagnose lumbosacral strain through a physical exam. In some cases, you may need tests such as X-ray exams.  TREATMENT  Treatment for your lower back injury depends on many factors that your clinician Meril have to evaluate. However, most treatment Jasyah include the use of anti-inflammatory medicines. HOME CARE INSTRUCTIONS   Avoid hard physical activities (tennis, racquetball, waterskiing) if you are not in proper physical condition for it. This may aggravate or create problems.  If you have a back problem, avoid sports requiring sudden body movements. Swimming and walking are generally safer activities.  Maintain good posture.  Maintain a healthy weight.  For acute conditions, you may put ice on the injured area.  Put ice in a plastic bag.  Place a towel between your skin and the bag.  Leave the ice on for 20 minutes, 2-3 times a day.  When the low back starts healing, stretching and strengthening exercises may be recommended. SEEK MEDICAL CARE IF:  Your back pain is getting worse.  You experience severe back pain not relieved with medicines. SEEK IMMEDIATE MEDICAL CARE IF:   You have numbness, tingling, weakness, or problems with the use of your arms or legs.  There is a change in bowel or bladder control.  You have increasing pain in any area of the body, including your belly (abdomen).  You notice shortness of breath, dizziness, or feel faint.  You feel sick to  your stomach (nauseous), are throwing up (vomiting), or become sweaty.  You notice discoloration of your toes or legs, or your feet get very cold. MAKE SURE YOU:   Understand these  instructions.  Mehtaab watch your condition.  Lamark get help right away if you are not doing well or get worse. Document Released: 12/01/2004 Document Revised: 02/26/2013 Document Reviewed: 10/10/2012 [facility] Patient Information 2015 [employer], [place]. This information is not intended to replace advice given to you by your health care provider. Make sure you discuss any questions you have with your health care provider.

## 2014-06-23 NOTE — ED Notes (Signed)
Walked in hallway w/o difficulty.

## 2014-06-23 NOTE — ED Provider Notes (Addendum)
CSN: [id number]     Arrival date & time 06/23/14  1117 History  This chart was scribed for Black & DeckerMarissa Gurnie Duris PA-C, working with Richardean Canalavid H Yao, MD by Elveria Risingimelie Horne, ED Scribe. This patient was seen in room TR05C/TR05C and the patient's care was started at 1:26 PM.   Chief Complaint  Patient presents with  . Back Pain    The history is provided by the patient. No language interpreter was used.  HPI Comments: Jonathan Khan is a 29 y.o. male with no pertinent PHMx who presents to the Emergency Department with a lower back injury incurred at work today. 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 Patient does not wear back brace and admits to lifting incorrectly. Patient denies audible pop or snap or indication of acute injury, but reports that after completing the job he developed constant, stabbing lower back pain. Patient denies radiation. Patient reports treatment with ibuprofen, but denies relief. Patient reports exacerbation of his pain with bending and states that he experiences his pain when sitting. Patient denies numbness, tingling, abdominal pain, nausea, vomiting, shortness of breath, difficulty breathing, urinary symptoms, or bowel/bladder incontinence. Patient denies history of back injuries. Patient with history of right hernial surgery repair.   [facility]    History reviewed. No pertinent past medical history. Past Surgical History  Procedure Laterality Date  . Hernia repair  02/27/12    RIH   Family History  Problem Relation Age of Onset  . Hyperlipidemia Mother   . Hypertension Father   . Colon cancer Neg Hx    History  Substance Use Topics  . Smoking status: Current Every Day Smoker -- 1.00 packs/day    Types: Cigarettes  . Smokeless tobacco: Not on file  . Alcohol Use: No     Comment: 3 times in the last one year    Review of Systems  Constitutional: Negative for fever and chills.  Respiratory:  Negative for shortness of breath.   Cardiovascular: Negative for chest pain.  Gastrointestinal: Negative for nausea, vomiting, abdominal pain, diarrhea and constipation.  Genitourinary: Negative for dysuria and hematuria.  Musculoskeletal: Positive for back pain. Negative for gait problem and neck pain.  Skin: Negative for color change and rash.  Neurological: Negative for weakness and numbness.    Allergies  Tramadol  Home Medications   Prior to Admission medications   Medication Sig Start Date End Date Taking? Authorizing Provider  cloNIDine (CATAPRES) 0.2 MG tablet Take 1 tablet (0.2 mg total) by mouth 2 (two) times daily. 04/03/14   Antony MaduraKelly Humes, PA-C  cyclobenzaprine (FLEXERIL) 10 MG tablet Take 0.5 tablets (5 mg total) by mouth at bedtime. 06/23/14   Teandra Harlan, PA-C  escitalopram (LEXAPRO) 10 MG tablet Take 1 tablet (10 mg total) by mouth daily. Patient not taking: Reported on 04/03/2014 05/24/13   Mary-Margaret Daphine DeutscherMartin, FNP  ibuprofen (ADVIL,MOTRIN) 600 MG tablet Take 1 tablet (600 mg total) by mouth every 6 (six) hours as needed. 04/03/14   Antony MaduraKelly Humes, PA-C  omeprazole (PRILOSEC) 20 MG capsule Take 1 capsule (20 mg total) by mouth daily before breakfast. Patient not taking: Reported on 04/03/2014 10/31/12   Tiffany KocherLeslie S Lewis, PA-C  ondansetron (ZOFRAN) 4 MG tablet Take 1 tablet (4 mg total) by mouth every 6 (six) hours. 04/03/14   Antony MaduraKelly Humes, PA-C   Triage Vitals: BP 127/79 mmHg  Pulse 83  Temp(Src) 97.3 F (36.3 C) (Oral)  Resp 18  Ht 5'  6" (1.676 m)  Wt 148 lb (67.132 kg)  BMI 23.90 kg/m2  SpO2 99% Physical Exam  Constitutional: He is oriented to person, place, and time. He appears well-developed and well-nourished. No distress.  HENT:  Head: Normocephalic. Not macrocephalic and not microcephalic. Head is with abrasion. Head is without raccoon's eyes, without Battle's sign and without contusion.    Mouth/Throat: Oropharynx is clear and moist. No oropharyngeal exudate.   Eyes: Conjunctivae and EOM are normal. Pupils are equal, round, and reactive to light. Right eye exhibits no discharge. Left eye exhibits no discharge.  Neck: Normal range of motion. Neck supple.  Cardiovascular: Normal rate, regular rhythm and normal heart sounds.  Exam reveals no friction rub.   No murmur heard. Pulmonary/Chest: Effort normal and breath sounds normal. No respiratory distress. He has no wheezes. He has no rales.  Musculoskeletal: Normal range of motion. He exhibits tenderness.       Lumbar back: He exhibits tenderness. He exhibits normal range of motion, no bony tenderness, no swelling, no edema, no deformity, no laceration and no pain.       Back:  Negative deformities identified to the spine. Negative bruising or ecchymosis identified, negative malalignments abnormalities noted. Tenderness upon palpation to the lumbosacral vertebral and paravertebral regions bilaterally. Negative swelling noted.  Full ROM to upper and lower extremities without difficulty noted, negative ataxia noted.  Neurological: He is alert and oriented to person, place, and time. No cranial nerve deficit. He exhibits normal muscle tone. Coordination normal.  Cranial nerves III-XII grossly intact Strength 5+/5+ to upper and lower extremities bilaterally with resistance applied, equal distribution noted Sensation intact with differentiation sharp and dull touch Negative saddle paresthesias bilaterally Negative arm drift Gait proper, proper balance - negative sway, negative drift, negative step-offs  Skin: Skin is warm and dry. No rash noted. He is not diaphoretic. No erythema.  Psychiatric: He has a normal mood and affect. His behavior is normal. Thought content normal.  Nursing note and vitals reviewed.   ED Course  Procedures (including critical care time)  COORDINATION OF CARE: 1:32 PM- Khalik order imaging of lumbar spine. Discussed treatment plan with patient at bedside and patient agreed to  plan.   Labs Review Labs Reviewed - No data to display  Imaging Review Dg Lumbar Spine Complete  06/23/2014   CLINICAL DATA:  Low back pain beginning this morning. The patient describes stabbing pain.  EXAM: LUMBAR SPINE - COMPLETE 4+ VIEW  COMPARISON:  None.  FINDINGS: Five non rib-bearing lumbar type vertebral bodies are present. Vertebral body heights alignment are maintained. The disc spaces are maintained. No acute fracture or traumatic subluxation is evident. There is no radiopaque foreign body. There is some straightening of the normal lumbar lordosis.  IMPRESSION: 1. No acute fracture or traumatic subluxation. 2. Straightening of the normal lumbar lordosis. This is nonspecific, but most commonly seen in the setting of muscle strain or ongoing pain.   Electronically Signed   By: Marin Roberts M.D.   On: 06/23/2014 14:47     EKG Interpretation None      MDM   Final diagnoses:  Back pain  Lumbosacral strain, initial encounter    Medications  oxyCODONE-acetaminophen (PERCOCET/ROXICET) 5-325 MG per tablet 1 tablet (1 tablet Oral Given 06/23/14 1440)    Filed Vitals:   06/23/14 1141  BP: 127/79  Pulse: 83  Temp: 97.3 F (36.3 C)  TempSrc: Oral  Resp: 18  Height:  (1.676 m)  Weight: 148 lb (  67.132 kg)  SpO2: 99%   I personally performed the services described in this documentation, which was scribed in my presence. The recorded information has been reviewed and is accurate.  Patient presenting to the ED with back pain that started today after lifting 75-100 pound boxes. Patient reports that he has been lifting incorrectly. Patient reports that the pain is localized to his lower back described as a stabbing pain worse with bending over. Negative deformities identified to the back. Tenderness upon palpation to vertebral and paravertebral regions bilaterally localized to the lumbosacral spine. Negative focal neurological deficits. Pulses palpable and strong. Strength  intact. Negative saddle paresthesias. Plain film of lumbar spine no acute fracture or traumatic subluxation identified. Straightening of the normal lordosis, identifying muscle strain.  Doubt cauda equina. Doubt epidural abscess. Suspicion to be muscular in nature secondary to pain upon palpation and pain with motion. Imaging unremarkable. Gait proper-negative step-offs or sway. Negative focal neurological deficits. Patient stable, afebrile. Patient not septic appearing. Discharged patient. Discharged patient with muscle relaxer-discussed course, cautions, disposal technique. Discussed with patient to avoid any physical strenuous activity. Recommended patient to get back brace. Discussed with patient to avoid any physical strenuous activity, no heavy lifting for the next couple of days. Discussed with patient to closely monitor symptoms and if symptoms are to worsen or change to report back to the ED - strict return instructions given.  Patient agreed to plan of care, understood, all questions answered.   Raymon Mutton, PA-C 06/23/14 1545  Richardean Canal, MD 06/23/14 1623  Raymon Mutton, PA-C 06/23/14 2346  Richardean Canal, MD 06/24/14 [phone]

## 2018-01-01 ENCOUNTER — Encounter (HOSPITAL_COMMUNITY): Payer: Self-pay | Admitting: Emergency Medicine

## 2018-01-01 ENCOUNTER — Emergency Department (HOSPITAL_COMMUNITY)
Admission: EM | Admit: 2018-01-01 | Discharge: 2018-01-01 | Disposition: A | Discharge status: Home / Self Care | Payer: Self-pay | Attending: Emergency Medicine | Admitting: Emergency Medicine

## 2018-01-01 ENCOUNTER — Emergency Department (HOSPITAL_COMMUNITY): Payer: Self-pay

## 2018-01-01 ENCOUNTER — Other Ambulatory Visit: Payer: Self-pay

## 2018-01-01 DIAGNOSIS — T148XXA Other injury of unspecified body region, initial encounter: Secondary | ICD-10-CM

## 2018-01-01 DIAGNOSIS — T1490XA Injury, unspecified, initial encounter: Secondary | ICD-10-CM

## 2018-01-01 DIAGNOSIS — Z79899 Other long term (current) drug therapy: Secondary | ICD-10-CM | POA: Insufficient documentation

## 2018-01-01 DIAGNOSIS — Z23 Encounter for immunization: Secondary | ICD-10-CM | POA: Insufficient documentation

## 2018-01-01 DIAGNOSIS — F17210 Nicotine dependence, cigarettes, uncomplicated: Secondary | ICD-10-CM | POA: Insufficient documentation

## 2018-01-01 DIAGNOSIS — L089 Local infection of the skin and subcutaneous tissue, unspecified: Secondary | ICD-10-CM | POA: Insufficient documentation

## 2018-01-01 LAB — CBC
HEMATOCRIT: 39.1 % (ref 39.0–52.0)
Hemoglobin: 12.6 g/dL — ABNORMAL LOW (ref 13.0–17.0)
MCH: 28.6 pg (ref 26.0–34.0)
MCHC: 32.2 g/dL (ref 30.0–36.0)
MCV: 88.9 fL (ref 80.0–100.0)
Platelets: 272 10*3/uL (ref 150–400)
RBC: 4.4 MIL/uL (ref 4.22–5.81)
RDW: 14.1 % (ref 11.5–15.5)
WBC: 9.7 10*3/uL (ref 4.0–10.5)
nRBC: 0 % (ref 0.0–0.2)

## 2018-01-01 LAB — BASIC METABOLIC PANEL
Anion gap: 8 (ref 5–15)
BUN: 9 mg/dL (ref 6–20)
CO2: 26 mmol/L (ref 22–32)
CREATININE: 0.65 mg/dL (ref 0.61–1.24)
Calcium: 8.9 mg/dL (ref 8.9–10.3)
Chloride: 102 mmol/L (ref 98–111)
GFR calc non Af Amer: >60 mL/min (ref >60)
GLUCOSE: 101 mg/dL — AB (ref 70–99)
Potassium: 3.8 mmol/L (ref 3.5–5.1)
Sodium: 136 mmol/L (ref 135–145)

## 2018-01-01 MED ORDER — IOHEXOL 300 MG/ML  SOLN
75.0000 mL | Freq: Once | INTRAMUSCULAR | Status: AC | PRN
  Administered 2018-01-01: 75 mL via INTRAVENOUS

## 2018-01-01 MED ORDER — CLINDAMYCIN HCL 150 MG PO CAPS
450.0000 mg | ORAL_CAPSULE | Freq: Once | ORAL | Status: AC
  Administered 2018-01-01: 450 mg via ORAL
  Filled 2018-01-01: qty 3

## 2018-01-01 MED ORDER — CLINDAMYCIN HCL 150 MG PO CAPS: 450.0000 mg | ORAL_CAPSULE | Freq: Three times a day (TID) | ORAL | 0 refills | Status: AC

## 2018-01-01 MED ORDER — TETANUS-DIPHTH-ACELL PERTUSSIS 5-2.5-18.5 LF-MCG/0.5 IM SUSP
0.5000 mL | Freq: Once | INTRAMUSCULAR | Status: AC
  Administered 2018-01-01: 0.5 mL via INTRAMUSCULAR
  Filled 2018-01-01: qty 0.5
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
## 2018-01-01 NOTE — Discharge Instructions (Addendum)
You were seen in the emergency today for a wound to your right lower leg.  Your tetanus was updated while you are here today.  The x-ray and CT scan we did did not show evidence of a foreign body in your leg or involvement in the muscle or tendon.  The wound appears infected and therefore we are placing you on clindamycin (450 mg 3 times per day).  Please take all of your antibiotics until finished. You may develop abdominal discomfort or diarrhea from the antibiotic.  You may help offset this with probiotics which you can buy at the store (ask your pharmacist if unable to find) or get probiotics in the form of eating yogurt. Do not eat or take the probiotics until 2 hours after your antibiotic. If you are unable to tolerate these side effects follow-up with your primary care provider or return to the emergency department.   If you begin to experience any blistering, rashes, swelling, or difficulty breathing seek medical care for evaluation of potentially more serious side effects.   Please be aware that this medication may interact with other medications you are taking, please be sure to discuss your medication list with your pharmacist.   Take Tylenol and/or Motrin per over-the-counter instructions to help with discomfort.  Please keep this area clean and dry and follow the attached wound care instructions.  The lab work we did show that your hemoglobin is somewhat low

## 2018-01-04 LAB — AEROBIC CULTURE W GRAM STAIN (SUPERFICIAL SPECIMEN)
Culture: NORMAL
Gram Stain: NONE SEEN
Special Requests: NORMAL
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
## 2022-03-07 DIAGNOSIS — Z419 Encounter for procedure for purposes other than remedying health state, unspecified: Secondary | ICD-10-CM | POA: Diagnosis not present

## 2022-04-07 DIAGNOSIS — Z419 Encounter for procedure for purposes other than remedying health state, unspecified: Secondary | ICD-10-CM | POA: Diagnosis not present

## 2022-05-06 DIAGNOSIS — Z419 Encounter for procedure for purposes other than remedying health state, unspecified: Secondary | ICD-10-CM | POA: Diagnosis not present

## 2022-06-06 DIAGNOSIS — Z419 Encounter for procedure for purposes other than remedying health state, unspecified: Secondary | ICD-10-CM | POA: Diagnosis not present

## 2022-07-06 DIAGNOSIS — Z419 Encounter for procedure for purposes other than remedying health state, unspecified: Secondary | ICD-10-CM | POA: Diagnosis not present

## 2022-08-06 DIAGNOSIS — Z419 Encounter for procedure for purposes other than remedying health state, unspecified: Secondary | ICD-10-CM | POA: Diagnosis not present

## 2022-09-05 DIAGNOSIS — Z419 Encounter for procedure for purposes other than remedying health state, unspecified: Secondary | ICD-10-CM | POA: Diagnosis not present

## 2022-10-06 DIAGNOSIS — Z419 Encounter for procedure for purposes other than remedying health state, unspecified: Secondary | ICD-10-CM | POA: Diagnosis not present

## 2022-11-06 DIAGNOSIS — Z419 Encounter for procedure for purposes other than remedying health state, unspecified: Secondary | ICD-10-CM | POA: Diagnosis not present

## 2022-12-06 DIAGNOSIS — Z419 Encounter for procedure for purposes other than remedying health state, unspecified: Secondary | ICD-10-CM | POA: Diagnosis not present
